# Patient Record
Sex: Female | Born: 1970 | Race: White | Hispanic: No | Smoking: Former smoker
Health system: Southern US, Community
[De-identification: ages and names within clinical notes are randomized; demographics above are authoritative.]

## PROBLEM LIST (undated history)

## (undated) DIAGNOSIS — R7989 Other specified abnormal findings of blood chemistry: Secondary | ICD-10-CM

## (undated) DIAGNOSIS — F319 Bipolar disorder, unspecified: Secondary | ICD-10-CM

## (undated) DIAGNOSIS — F419 Anxiety disorder, unspecified: Secondary | ICD-10-CM

## (undated) DIAGNOSIS — Z720 Tobacco use: Secondary | ICD-10-CM

## (undated) DIAGNOSIS — I10 Essential (primary) hypertension: Secondary | ICD-10-CM

## (undated) DIAGNOSIS — N641 Fat necrosis of breast: Secondary | ICD-10-CM

## (undated) DIAGNOSIS — Z8619 Personal history of other infectious and parasitic diseases: Secondary | ICD-10-CM

## (undated) DIAGNOSIS — K589 Irritable bowel syndrome without diarrhea: Secondary | ICD-10-CM

## (undated) DIAGNOSIS — B20 Human immunodeficiency virus [HIV] disease: Secondary | ICD-10-CM

## (undated) DIAGNOSIS — Z21 Asymptomatic human immunodeficiency virus [HIV] infection status: Secondary | ICD-10-CM

## (undated) HISTORY — PX: CHOLECYSTECTOMY: SHX55

## (undated) HISTORY — DX: Essential (primary) hypertension: I10

## (undated) HISTORY — DX: Anxiety disorder, unspecified: F41.9

## (undated) HISTORY — PX: INJECTION OF SILICONE OIL: SHX6422

## (undated) HISTORY — PX: CYST REMOVAL LEG: SHX6280

## (undated) HISTORY — DX: Human immunodeficiency virus (HIV) disease: B20

## (undated) HISTORY — PX: CYST EXCISION: SHX5701

## (undated) HISTORY — DX: Bipolar disorder, unspecified: F31.9

## (undated) HISTORY — DX: Personal history of other infectious and parasitic diseases: Z86.19

## (undated) HISTORY — DX: Irritable bowel syndrome, unspecified: K58.9

## (undated) HISTORY — DX: Other specified abnormal findings of blood chemistry: R79.89

## (undated) HISTORY — DX: Asymptomatic human immunodeficiency virus (hiv) infection status: Z21

## (undated) HISTORY — DX: Tobacco use: Z72.0

## (undated) HISTORY — DX: Fat necrosis of breast: N64.1

---

## 1991-05-11 HISTORY — PX: COLONOSCOPY: SHX174

## 2017-03-10 DIAGNOSIS — Z8619 Personal history of other infectious and parasitic diseases: Secondary | ICD-10-CM | POA: Insufficient documentation

## 2017-03-10 HISTORY — DX: Personal history of other infectious and parasitic diseases: Z86.19

## 2017-09-14 ENCOUNTER — Ambulatory Visit (INDEPENDENT_AMBULATORY_CARE_PROVIDER_SITE_OTHER): Payer: BLUE CROSS/BLUE SHIELD | Admitting: Physician Assistant

## 2017-09-14 ENCOUNTER — Encounter: Payer: Self-pay | Admitting: Physician Assistant

## 2017-09-14 VITALS — BP 145/84 | HR 99 | Ht 68.0 in | Wt 244.0 lb

## 2017-09-14 DIAGNOSIS — F17210 Nicotine dependence, cigarettes, uncomplicated: Secondary | ICD-10-CM

## 2017-09-14 DIAGNOSIS — Z13220 Encounter for screening for lipoid disorders: Secondary | ICD-10-CM

## 2017-09-14 DIAGNOSIS — Z720 Tobacco use: Secondary | ICD-10-CM | POA: Insufficient documentation

## 2017-09-14 DIAGNOSIS — R7989 Other specified abnormal findings of blood chemistry: Secondary | ICD-10-CM

## 2017-09-14 DIAGNOSIS — B370 Candidal stomatitis: Secondary | ICD-10-CM

## 2017-09-14 DIAGNOSIS — K589 Irritable bowel syndrome without diarrhea: Secondary | ICD-10-CM | POA: Insufficient documentation

## 2017-09-14 DIAGNOSIS — R030 Elevated blood-pressure reading, without diagnosis of hypertension: Secondary | ICD-10-CM

## 2017-09-14 DIAGNOSIS — Z716 Tobacco abuse counseling: Secondary | ICD-10-CM

## 2017-09-14 DIAGNOSIS — Z1329 Encounter for screening for other suspected endocrine disorder: Secondary | ICD-10-CM

## 2017-09-14 DIAGNOSIS — F1290 Cannabis use, unspecified, uncomplicated: Secondary | ICD-10-CM

## 2017-09-14 DIAGNOSIS — Z7689 Persons encountering health services in other specified circumstances: Secondary | ICD-10-CM

## 2017-09-14 DIAGNOSIS — E349 Endocrine disorder, unspecified: Secondary | ICD-10-CM

## 2017-09-14 DIAGNOSIS — Z131 Encounter for screening for diabetes mellitus: Secondary | ICD-10-CM

## 2017-09-14 DIAGNOSIS — Z130 Encounter for screening for diseases of the blood and blood-forming organs and certain disorders involving the immune mechanism: Secondary | ICD-10-CM

## 2017-09-14 DIAGNOSIS — Z1231 Encounter for screening mammogram for malignant neoplasm of breast: Secondary | ICD-10-CM

## 2017-09-14 MED ORDER — CLOTRIMAZOLE 10 MG MT TROC: 10.0000 mg | Freq: Every day | OROMUCOSAL | 0 refills | Status: AC

## 2017-09-14 MED ORDER — NICOTINE POLACRILEX 4 MG MT GUM: 4.0000 mg | CHEWING_GUM | OROMUCOSAL | 0 refills | Status: DC | PRN

## 2017-09-14 MED ORDER — NICOTINE 14 MG/24HR TD PT24: 14.0000 mg | MEDICATED_PATCH | Freq: Every day | TRANSDERMAL | 1 refills | Status: DC

## 2017-09-14 NOTE — Progress Notes (Signed)
HPI:                                                                Holly Walker is a 47 y.o. adult who presents to [name]: Primary Care Sports Medicine today to establish care  Current concerns: smoking cessation, HRT  Tobacco use: Currently smokes 10 cigarettes per day Began smoking approx 20 years ago First cigarette in the morning Longest quit attempt: Wellbutrin x 14 months  HRT: She is also interested in re-starting hormone replacement therapy. 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 She has been on Spironolactone and oral estrogen in the past (~10 years ago). No history of thromboembolism. Has never had a mammogram.  Depression screen [facility] 2/9 09/14/2017  Decreased Interest 1  Down, Depressed, Hopeless 0  PHQ - 2 Score 1  Altered sleeping 2  Tired, decreased energy 2  Change in appetite 2  Feeling bad or failure about yourself  0  Trouble concentrating 0  Moving slowly or fidgety/restless 0  Suicidal thoughts 0  PHQ-9 Score 7    GAD 7 : Generalized Anxiety Score 09/14/2017  Nervous, Anxious, on Edge 1  Control/stop worrying 1  Worry too much - different things 1  Trouble relaxing 1  Restless 0  Easily annoyed or irritable 1  Afraid - awful might happen 0  Total GAD 7 Score 5      Past Medical History:  Diagnosis Date  . Bipolar disorder (HCC)   . History of shingles 03/2017   right flank  . IBS (irritable bowel syndrome)   . Tobacco use    Past Surgical History:  Procedure Laterality Date  . CHOLECYSTECTOMY    . INJECTION OF SILICONE OIL     Social History   Tobacco Use  . Smoking status: Current Every Day Smoker    Packs/day: 0.50    Years: 20.00    Pack years: 10.00    Types: Cigarettes  . Smokeless tobacco: Never Used  Substance Use Topics  . Alcohol use: Not Currently    Alcohol/week: 0.6 - 1.2 oz    Types: 1 - 2 Standard drinks or equivalent per week   family history includes Diabetes in her maternal  grandfather and paternal grandmother; Heart attack in her maternal grandfather; Skin cancer in her maternal grandmother; Stroke in her maternal grandmother and paternal grandfather; Valvular heart disease in her maternal grandfather.    ROS: Review of Systems  Respiratory: Positive for wheezing.   Cardiovascular: Positive for palpitations.  Neurological: Positive for dizziness.  Psychiatric/Behavioral: Positive for depression (bipolar). The patient has insomnia.   All other systems reviewed and are negative.    Medications: Current Outpatient Medications  Medication Sig Dispense Refill  . clotrimazole (MYCELEX) 10 MG troche Take 1 tablet (10 mg total) by mouth 5 (five) times daily for 7 days. 35 tablet 0  . nicotine (NICODERM CQ) 14 mg/24hr patch Place 1 patch (14 mg total) onto the skin daily. 28 patch 1  . nicotine polacrilex (NICORETTE) 4 MG gum Take 1 each (4 mg total) by mouth as needed for smoking cessation. 100 tablet 0   No current facility-administered medications for this visit.    No Known Allergies     Objective:  BP (!) 145/84   Pulse 99   Ht  (1.727 m)   Wt 244 lb (110.7 kg)   BMI 37.10 kg/m  Gen:  alert, not ill-appearing, no distress, appropriate for age, obese female HEENT: head normocephalic without obvious abnormality, conjunctiva and cornea clear, oropharynx with white plaques, neck supple, no cervical adenopathy, trachea midline Pulm: Normal work of breathing, normal phonation, clear to auscultation bilaterally, no wheezes, rales or rhonchi CV: Normal rate, regular rhythm, s1 and s2 distinct, no murmurs, clicks or rubs  Neuro: alert and oriented x 3, no tremor MSK: extremities atraumatic, normal gait and station Skin: intact, no rashes on exposed skin, no jaundice, no cyanosis Psych: well-groomed, cooperative, good eye contact, euthymic mood, affect mood-congruent, speech is articulate, and thought processes clear and goal-directed    No results  found for this or any previous visit (from the past 72 hour(s)). No results found.    Assessment and Plan: 47 y.o. adult with   Encounter to establish care  Breast cancer screening by mammogram - Plan: MM 3D SCREEN BREAST BILATERAL  Endocrine disorder - Plan: Estradiol, Testosterone  Screening for diabetes mellitus - Plan: Hemoglobin A1c  Screening for blood disease - Plan: CBC, Comprehensive metabolic panel  Screening for thyroid disorder - Plan: TSH + free T4  Screening for lipid disorders - Plan: Lipid Panel w/reflex Direct LDL  Cigarette nicotine dependence without complication - Plan: nicotine (NICODERM CQ) 14 mg/24hr patch, nicotine polacrilex (NICORETTE) 4 MG gum  Elevated blood pressure reading  Oral candidiasis - Plan: clotrimazole (MYCELEX) 10 MG troche  Encounter for smoking cessation counseling  Marijuana smoker, episodic  - Personally reviewed PMH, PSH, PFH, medications, allergies, HM - Age-appropriate cancer screening: mammogram ordered, optional prostate cancer screening beginning age 46 - Influenza n/a - Tdap declined - PHQ2 negative  Endocrine disorder unspecified - labs pending, declines STI screening today - smoking cessation very important to reduce risk of VTE - provided with consent to HRT forms to review, initial and sign. Return at follow-up visit - DMV sex designation form completed - plan for oral estradiol 2-6 mg/d or estradiol patch 0.025-0.2 mg/d changed Q3-5d depending on patient preference. Optional Spironolactone 100-300 mg daily  Elevated blood pressure reading BP Readings from Last 3 Encounters:  09/14/17 (!) 145/84  - BP out of rnage in office today. Patient to monitor BP's at home and log readings - counseled on therapeutic lifestyle changes  Smoking cessation counseling Smoking cessation instruction/counseling given:  counseled patient on the dangers of tobacco use, advised patient to stop smoking, and reviewed strategies to  maximize success - discussed treatment options to include Wellbutrin, Chantix, and nicotine replacement. Avoiding Chantix due to patient's history of mood disorder and sleep difficulties. Patient opted for nicotine replacement. Will start 14 mg Nicoderm patch and supplement with gum/lozenge Weeks 1: nicoderm patch daily, use gum every 2 hours as needed Week 2: patch + gum every 4 hours as needed Week 3: patch + 1/2 dose gum every 4 hours as needed Week 4: patch only  Patient education and anticipatory guidance given Patient agrees with treatment plan Follow-up in 4 weeks for smoking cessation / blood pressure / HRT or sooner as needed if symptoms worsen or fail to improve  Levonne Hubert PA-C

## 2017-09-14 NOTE — Patient Instructions (Addendum)
Set a Quit Date Make a Quit IT trainer [place] Quit Line and download a Quit app No cigarettes beginning on your quit date Weeks 1: nicoderm patch daily, use gum every 2 hours as needed Week 2: patch + gum every 4 hours as needed Week 3: patch + 1/2 dose gum every 4 hours as needed Week 4: patch only   Coping with Quitting Smoking Quitting smoking is a physical and mental challenge. You will face cravings, withdrawal symptoms, and temptation. Before quitting, work with your health care provider to make a plan that can help you cope. Preparation can help you quit and keep you from giving in. How can I cope with cravings? Cravings usually last for 5-10 minutes. If you get through it, the craving will pass. Consider taking the following actions to help you cope with cravings:  Keep your mouth busy: ? Chew sugar-free gum. ? Suck on hard candies or a straw. ? Brush your teeth.  Keep your hands and body busy: ? Immediately change to a different activity when you feel a craving. ? Squeeze or play with a ball. ? Do an activity or a hobby, like making bead jewelry, practicing needlepoint, or working with wood. ? Mix up your normal routine. ? Take a short exercise break. Go for a quick walk or run up and down stairs. ? Spend time in public places where smoking is not allowed.  Focus on doing something kind or helpful for someone else.  Call a friend or family member to talk during a craving.  Join a support group.  Call a quit line, such as 1-800-QUIT-NOW.  Talk with your health care provider about medicines that might help you cope with cravings and make quitting easier for you.  How can I deal with withdrawal symptoms? Your body may experience negative effects as it tries to get used to not having nicotine in the system. These effects are called withdrawal symptoms. They may include:  Feeling hungrier than normal.  Trouble concentrating.  Irritability.  Trouble  sleeping.  Feeling depressed.  Restlessness and agitation.  Craving a cigarette.  To manage withdrawal symptoms:  Avoid places, people, and activities that trigger your cravings.  Remember why you want to quit.  Get plenty of sleep.  Avoid coffee and other caffeinated drinks. These may worsen some of your symptoms.  How can I handle social situations? Social situations can be difficult when you are quitting smoking, especially in the first few weeks. To manage this, you can:  Avoid parties, bars, and other social situations where people might be smoking.  Avoid alcohol.  Leave right away if you have the urge to smoke.  Explain to your family and friends that you are quitting smoking. Ask for understanding and support.  Plan activities with friends or family where smoking is not an option.  What are some ways I can cope with stress? Wanting to smoke may cause stress, and stress can make you want to smoke. Find ways to manage your stress. Relaxation techniques can help. For example:  Breathe slowly and deeply, in through your nose and out through your mouth.  Listen to soothing, relaxing music.  Talk with a family member or friend about your stress.  Light a candle.  Soak in a bath or take a shower.  Think about a peaceful place.  What are some ways I can prevent weight gain? Be aware that many people gain weight after they quit smoking. However, not everyone does. To keep  from gaining weight, have a plan in place before you quit and stick to the plan after you quit. Your plan should include:  Having healthy snacks. When you have a craving, it may help to: ? Eat plain popcorn, crunchy carrots, celery, or other cut vegetables. ? Chew sugar-free gum.  Changing how you eat: ? Eat small portion sizes at meals. ? Eat 4-6 small meals throughout the day instead of 1-2 large meals a day. ? Be mindful when you eat. Do not watch television or do other things that might  distract you as you eat.  Exercising regularly: ? Make time to exercise each day. If you do not have time for a long workout, do short bouts of exercise for 5-10 minutes several times a day. ? Do some form of strengthening exercise, like weight lifting, and some form of aerobic exercise, like running or swimming.  Drinking plenty of water or other low-calorie or no-calorie drinks. Drink 6-8 glasses of water daily, or as much as instructed by your health care provider.  Summary  Quitting smoking is a physical and mental challenge. You will face cravings, withdrawal symptoms, and temptation to smoke again. Preparation can help you as you go through these challenges.  You can cope with cravings by keeping your mouth busy (such as by chewing gum), keeping your body and hands busy, and making calls to family, friends, or a helpline for people who want to quit smoking.  You can cope with withdrawal symptoms by avoiding places where people smoke, avoiding drinks with caffeine, and getting plenty of rest.  Ask your health care provider about the different ways to prevent weight gain, avoid stress, and handle social situations. This information is not intended to replace advice given to you by your health care provider. Make sure you discuss any questions you have with your health care provider. Document Released: 04/23/2016 Document Revised: 04/23/2016 Document Reviewed: 04/23/2016 Elsevier Interactive Patient Education  [employer].

## 2017-09-17 LAB — HEMOGLOBIN A1C
EAG (MMOL/L): 5.7 (calc)
HEMOGLOBIN A1C: 5.2 %{Hb} (ref <5.7)
MEAN PLASMA GLUCOSE: 103 (calc)

## 2017-09-17 LAB — COMPREHENSIVE METABOLIC PANEL
AG Ratio: 1.4 (calc) (ref 1.0–2.5)
ALBUMIN MSPROF: 4.3 g/dL (ref 3.6–5.1)
ALKALINE PHOSPHATASE (APISO): 68 U/L (ref 40–115)
ALT: 22 U/L (ref 9–46)
AST: 21 U/L (ref 10–40)
BILIRUBIN TOTAL: 0.4 mg/dL (ref 0.2–1.2)
BUN: 13 mg/dL (ref 7–25)
CALCIUM: 9.1 mg/dL (ref 8.6–10.3)
CHLORIDE: 106 mmol/L (ref 98–110)
CO2: 27 mmol/L (ref 20–32)
Creat: 0.74 mg/dL (ref 0.60–1.35)
GLOBULIN: 3 g/dL (ref 1.9–3.7)
Glucose, Bld: 99 mg/dL (ref 65–99)
POTASSIUM: 4.4 mmol/L (ref 3.5–5.3)
SODIUM: 138 mmol/L (ref 135–146)
Total Protein: 7.3 g/dL (ref 6.1–8.1)

## 2017-09-17 LAB — LIPID PANEL W/REFLEX DIRECT LDL
CHOLESTEROL: 125 mg/dL (ref <200)
HDL: 34 mg/dL — ABNORMAL LOW (ref >40)
LDL Cholesterol (Calc): 72 mg/dL (calc)
Non-HDL Cholesterol (Calc): 91 mg/dL (calc) (ref <130)
Total CHOL/HDL Ratio: 3.7 (calc) (ref <5.0)
Triglycerides: 102 mg/dL (ref <150)

## 2017-09-17 LAB — CBC
HEMATOCRIT: 43.7 % (ref 38.5–50.0)
HEMOGLOBIN: 15.3 g/dL (ref 13.2–17.1)
MCH: 32.7 pg (ref 27.0–33.0)
MCHC: 35 g/dL (ref 32.0–36.0)
MCV: 93.4 fL (ref 80.0–100.0)
MPV: 9.8 fL (ref 7.5–12.5)
Platelets: 178 10*3/uL (ref 140–400)
RBC: 4.68 10*6/uL (ref 4.20–5.80)
RDW: 12.8 % (ref 11.0–15.0)
WBC: 4.3 10*3/uL (ref 3.8–10.8)

## 2017-09-17 LAB — T4, FREE: Free T4: 1 ng/dL (ref 0.8–1.8)

## 2017-09-17 LAB — TESTOSTERONE: TESTOSTERONE: 391 ng/dL (ref 250–827)

## 2017-09-17 LAB — ESTRADIOL: Estradiol: 39 pg/mL (ref <=39)

## 2017-09-17 LAB — TSH+FREE T4: TSH W/REFLEX TO FT4: 0.36 mIU/L — ABNORMAL LOW (ref 0.40–4.50)

## 2017-09-20 ENCOUNTER — Encounter: Payer: Self-pay | Admitting: Physician Assistant

## 2017-09-20 DIAGNOSIS — Z1231 Encounter for screening mammogram for malignant neoplasm of breast: Secondary | ICD-10-CM | POA: Insufficient documentation

## 2017-09-20 DIAGNOSIS — R030 Elevated blood-pressure reading, without diagnosis of hypertension: Secondary | ICD-10-CM | POA: Insufficient documentation

## 2017-09-20 DIAGNOSIS — F17210 Nicotine dependence, cigarettes, uncomplicated: Secondary | ICD-10-CM | POA: Insufficient documentation

## 2017-09-20 DIAGNOSIS — Z716 Tobacco abuse counseling: Secondary | ICD-10-CM | POA: Insufficient documentation

## 2017-09-20 DIAGNOSIS — E349 Endocrine disorder, unspecified: Secondary | ICD-10-CM | POA: Insufficient documentation

## 2017-09-20 DIAGNOSIS — B370 Candidal stomatitis: Secondary | ICD-10-CM | POA: Insufficient documentation

## 2017-09-20 DIAGNOSIS — F319 Bipolar disorder, unspecified: Secondary | ICD-10-CM | POA: Insufficient documentation

## 2017-09-20 DIAGNOSIS — F1290 Cannabis use, unspecified, uncomplicated: Secondary | ICD-10-CM | POA: Insufficient documentation

## 2017-09-20 NOTE — Progress Notes (Signed)
Labs look great overall - thyroid function was abnormal so I am going to recheck this in a week  Otherwise - normal kidney function - cholesterol in a healthy range - normal blood counts - no evidence of diabetes  Schedule follow-up appointment so we can start estrogen

## 2017-09-21 DIAGNOSIS — R7989 Other specified abnormal findings of blood chemistry: Secondary | ICD-10-CM | POA: Insufficient documentation

## 2017-09-21 HISTORY — DX: Other specified abnormal findings of blood chemistry: R79.89

## 2017-09-21 NOTE — Addendum Note (Signed)
Addended by: Gena Fray E on: 09/21/2017 11:45 AM   Modules accepted: Orders

## 2017-09-29 ENCOUNTER — Ambulatory Visit: Payer: BLUE CROSS/BLUE SHIELD

## 2017-09-29 ENCOUNTER — Telehealth: Payer: Self-pay | Admitting: Physician Assistant

## 2017-09-29 ENCOUNTER — Other Ambulatory Visit: Payer: Self-pay

## 2017-09-29 DIAGNOSIS — R7989 Other specified abnormal findings of blood chemistry: Secondary | ICD-10-CM

## 2017-09-29 DIAGNOSIS — N63 Unspecified lump in unspecified breast: Secondary | ICD-10-CM

## 2017-09-29 DIAGNOSIS — Z1231 Encounter for screening mammogram for malignant neoplasm of breast: Secondary | ICD-10-CM

## 2017-09-29 NOTE — Telephone Encounter (Signed)
-----   Message from Almeta Monas sent at 09/29/2017 10:48 AM EDT ----- [facility] Morning! This pt came down this am wanting the Mammo done, unfortunately this pt has to go to the [facility], when I was talking with the patient she told me that she is feeling lumpies and bumpies and has had several silicon injections over the years, due to these both she will have to go to [facility] and have a Diagnostic. I spoke at length with the pt and she was very appreciative and understanding that this is path we have to take due to hx and presenting with problems.   Thanks, Denny Peon

## 2017-09-30 LAB — THYROGLOBULIN ANTIBODIES (REFL)

## 2017-09-30 LAB — T3, FREE: T3, Free: 3 pg/mL (ref 2.3–4.2)

## 2017-10-09 NOTE — Progress Notes (Signed)
Thyroid hormones and thyroid antibody are in a normal range I would like to get a thyroid ultrasound to make sure there is not a nodule that is secreting hormone and causing TSH to be low This has been ordered and [facility]

## 2017-10-12 ENCOUNTER — Encounter: Payer: Self-pay | Admitting: Physician Assistant

## 2017-10-12 ENCOUNTER — Ambulatory Visit (INDEPENDENT_AMBULATORY_CARE_PROVIDER_SITE_OTHER): Payer: BLUE CROSS/BLUE SHIELD | Admitting: Physician Assistant

## 2017-10-12 VITALS — BP 130/83 | HR 72 | Resp 16 | Wt 247.3 lb

## 2017-10-12 DIAGNOSIS — E349 Endocrine disorder, unspecified: Secondary | ICD-10-CM | POA: Diagnosis not present

## 2017-10-12 DIAGNOSIS — I10 Essential (primary) hypertension: Secondary | ICD-10-CM | POA: Diagnosis not present

## 2017-10-12 MED ORDER — ESTRADIOL 2 MG PO TABS: 2.0000 mg | ORAL_TABLET | Freq: Every day | ORAL | 2 refills | Status: DC

## 2017-10-12 MED ORDER — SPIRONOLACTONE 100 MG PO TABS: 100.0000 mg | ORAL_TABLET | Freq: Every day | ORAL | 2 refills | Status: DC

## 2017-10-12 NOTE — Progress Notes (Signed)
HPI:                                                                Holly Walker is a 47 y.o. adult who presents to [facility]: Primary Care Sports Medicine today for 2 month follow-up  Quit smoking cold [place] Sep 16, 2017. Reports withdrawal symptoms for the first 3 days including sleep disturbance. Has used chewing gum, suckers and an unlit cigarette to manage cravings.   Here today to re-start cross-sex hormone therapy. Main concern is hair loss. Labs from 09/16/17 show total testosterone is high (391) and estradiol is low (39). It has been over a year since she has been on any hormone therapy.  Has checked BP at the pharmacy on 2 occasions and reports results were yellow and green. Forgot to purchase a BP cuff but intends to.  Depression screen PHQ 2/9 09/14/2017  Decreased Interest 1  Down, Depressed, Hopeless 0  PHQ - 2 Score 1  Altered sleeping 2  Tired, decreased energy 2  Change in appetite 2  Feeling bad or failure about yourself  0  Trouble concentrating 0  Moving slowly or fidgety/restless 0  Suicidal thoughts 0  PHQ-9 Score 7    GAD 7 : Generalized Anxiety Score 09/14/2017  Nervous, Anxious, on Edge 1  Control/stop worrying 1  Worry too much - different things 1  Trouble relaxing 1  Restless 0  Easily annoyed or irritable 1  Afraid - awful might happen 0  Total GAD 7 Score 5      Past Medical History:  Diagnosis Date  . Bipolar disorder (HCC)   . History of shingles 03/2017   right flank  . IBS (irritable bowel syndrome)   . Tobacco use    Past Surgical History:  Procedure Laterality Date  . CHOLECYSTECTOMY    . INJECTION OF SILICONE OIL     Social History   Tobacco Use  . Smoking status: Current Every Day Smoker    Packs/day: 0.50    Years: 20.00    Pack years: 10.00    Types: Cigarettes  . Smokeless tobacco: Never Used  Substance Use Topics  . Alcohol use: Not Currently    Alcohol/week: 0.6 - 1.2 oz    Types: 1 - 2  Standard drinks or equivalent per week   family history includes Diabetes in her maternal grandfather and paternal grandmother; Heart attack in her maternal grandfather; Skin cancer in her maternal grandmother; Stroke in her maternal grandmother and paternal grandfather; Valvular heart disease in her maternal grandfather.    ROS: negative except as noted in the HPI  Medications: Current Outpatient Medications  Medication Sig Dispense Refill  . nicotine (NICODERM CQ) 14 mg/24hr patch Place 1 patch (14 mg total) onto the skin daily. (Patient not taking: Reported on 10/12/2017) 28 patch 1  . nicotine polacrilex (NICORETTE) 4 MG gum Take 1 each (4 mg total) by mouth as needed for smoking cessation. (Patient not taking: Reported on 10/12/2017) 100 tablet 0   No current facility-administered medications for this visit.    No Known Allergies     Objective:  BP 130/83   Pulse 72   Resp 16   Wt 247 lb 4.8 oz (112.2 kg)   SpO2  100%   BMI 37.60 kg/m  Gen:  alert, not ill-appearing, no distress, appropriate for age, obese female HEENT: head normocephalic without obvious abnormality, conjunctiva and cornea clear, trachea midline Pulm: Normal work of breathing, normal phonation, clear to auscultation bilaterally, no wheezes, rales or rhonchi CV: Normal rate, regular rhythm, s1 and s2 distinct, no murmurs, clicks or rubs  Neuro: alert and oriented x 3, no tremor MSK: extremities atraumatic, normal gait and station Skin: intact, no rashes on exposed skin, no jaundice, no cyanosis Psych: well-groomed, cooperative, good eye contact, euthymic mood, affect mood-congruent, speech is articulate, and thought processes clear and goal-directed  Lab Results  Component Value Date   CREATININE 0.74 09/16/2017   BUN 13 09/16/2017   NA 138 09/16/2017   K 4.4 09/16/2017   CL 106 09/16/2017   CO2 27 09/16/2017   Lab Results  Component Value Date   ALT 22 09/16/2017   AST 21 09/16/2017   BILITOT 0.4  09/16/2017   Lab Results  Component Value Date   WBC 4.3 09/16/2017   HGB 15.3 09/16/2017   HCT 43.7 09/16/2017   MCV 93.4 09/16/2017   PLT 178 09/16/2017     Lab Results  Component Value Date   TESTOSTERONE 391 09/16/2017      No results found for this or any previous visit (from the past 72 hour(s)). No results found.    Assessment and Plan: 47 y.o. adult with   Endocrine disorder - Plan: spironolactone (ALDACTONE) 100 MG tablet, estradiol (ESTRACE) 2 MG tablet - pleasant 47 yo transgender female here for cross-sex hormone therapy. Risks/benefits and consent to treatment were reviewed in detail at last visit. Labs including renal and liver function were performed last month and are wnl. Testosterone and estradiol are in female physiologic range as expected due to absence of exogenous hormone - since there is concern about hair loss, starting Spironolactone at 100 mg daily - starting Estradiol 2 mg daily. May switch to transdermal preparation + baby asa if she resumes smoking, but hopefully that won't be the case - recheck labs in 3 months   Also instructed to contact breast center to schedule mammogram. Order was changed from screening to diagnostic due to hx of silicone injections  Hypertension BP Readings from Last 3 Encounters:  10/12/17 130/83  09/14/17 (!) 145/84  - counseled on therapeutic lifestyle changes - praised for smoking cessation. Will consider baby asa if she resumes smoking for primary prevention. Would also consider antihypertensive medication at that point - patient to monitor BP at home and record readings   Patient education and anticipatory guidance given Patient agrees with treatment plan Follow-up in 3 months or sooner as needed if symptoms worsen or fail to improve  Levonne Hubertharley E. Orissa Arreaga PA-C

## 2017-10-12 NOTE — Patient Instructions (Addendum)
Call [facility] to schedule mammogram [phone]   Start Oral Estradiol 2 mg daily and Spironolactone 100 mg daily Follow-up in 3 months

## 2017-10-26 ENCOUNTER — Other Ambulatory Visit: Payer: Self-pay | Admitting: Physician Assistant

## 2017-10-26 DIAGNOSIS — N63 Unspecified lump in unspecified breast: Secondary | ICD-10-CM

## 2017-11-04 ENCOUNTER — Ambulatory Visit: Payer: BLUE CROSS/BLUE SHIELD

## 2017-11-04 ENCOUNTER — Ambulatory Visit
Admission: RE | Admit: 2017-11-04 | Discharge: 2017-11-04 | Disposition: A | Discharge status: Home / Self Care | Payer: BLUE CROSS/BLUE SHIELD | Source: Ambulatory Visit | Attending: Physician Assistant | Admitting: Physician Assistant

## 2017-11-04 DIAGNOSIS — N63 Unspecified lump in unspecified breast: Secondary | ICD-10-CM

## 2017-11-06 ENCOUNTER — Encounter: Payer: Self-pay | Admitting: Physician Assistant

## 2017-11-06 DIAGNOSIS — N641 Fat necrosis of breast: Secondary | ICD-10-CM | POA: Insufficient documentation

## 2017-11-06 HISTORY — DX: Fat necrosis of breast: N64.1

## 2017-11-06 NOTE — Progress Notes (Signed)
Mammogram does not show any concerning masses There are some areas of fat necrosis related to the silicone. Would recommend following up with a plastic surgeon as needed Otherwise, guidelines recommend screening mammogram every 2 years beginning at age 46

## 2018-01-12 ENCOUNTER — Other Ambulatory Visit: Payer: Self-pay | Admitting: Physician Assistant

## 2018-01-12 DIAGNOSIS — E349 Endocrine disorder, unspecified: Secondary | ICD-10-CM

## 2018-02-06 ENCOUNTER — Ambulatory Visit (INDEPENDENT_AMBULATORY_CARE_PROVIDER_SITE_OTHER): Payer: BLUE CROSS/BLUE SHIELD | Admitting: Physician Assistant

## 2018-02-06 VITALS — BP 121/82 | HR 76 | Wt 257.0 lb

## 2018-02-06 DIAGNOSIS — Z79899 Other long term (current) drug therapy: Secondary | ICD-10-CM | POA: Diagnosis not present

## 2018-02-06 DIAGNOSIS — L659 Nonscarring hair loss, unspecified: Secondary | ICD-10-CM | POA: Diagnosis not present

## 2018-02-06 DIAGNOSIS — I10 Essential (primary) hypertension: Secondary | ICD-10-CM

## 2018-02-06 DIAGNOSIS — E349 Endocrine disorder, unspecified: Secondary | ICD-10-CM

## 2018-02-06 DIAGNOSIS — L989 Disorder of the skin and subcutaneous tissue, unspecified: Secondary | ICD-10-CM

## 2018-02-06 DIAGNOSIS — L720 Epidermal cyst: Secondary | ICD-10-CM

## 2018-02-06 MED ORDER — ESTRADIOL 2 MG PO TABS: ORAL_TABLET | ORAL | 5 refills | Status: DC

## 2018-02-06 MED ORDER — MINOXIDIL 5 % EX FOAM: 1.0000 mL | Freq: Two times a day (BID) | CUTANEOUS | 1 refills | Status: DC

## 2018-02-06 MED ORDER — SPIRONOLACTONE 100 MG PO TABS: 100.0000 mg | ORAL_TABLET | Freq: Two times a day (BID) | ORAL | 5 refills | Status: DC

## 2018-02-06 NOTE — Progress Notes (Signed)
HPI:                                                                Holly Walker is a 47 y.o. adult who presents to [name]: Primary Care Sports Medicine today for medication management  Very pleasant 47 yo transgender female, AMAB. Doing well on oral estradiol 2 mg daily and spironolactone 100 mg bid.  Main concern is thinning hair.  HTN: managed with lifestyle. Does not checks BP's at home. Denies vision change, headache, chest pain with exertion, orthopnea, lightheadedness, syncope and edema. Risk factors include: family hx, obesity  Also reports a recurrent nodule of the left posterior thigh. Has been present off and on for several years. It occasionally swells and becomes red. Denies tenderness or drainage.  Also complains of a skin lesion of left neck. It has been catching on clothing. Denies tenderness, bleeding, or itching. It has been present for at least the past week, perhaps longer. Denies personal or family history of skin cancer.   Past Medical History:  Diagnosis Date  . Bipolar disorder (HCC)   . 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 History of shingles 03/2017   right flank  . IBS (irritable bowel syndrome)   . Low serum thyroid stimulating hormone (TSH) 09/21/2017  . Tobacco use    Past Surgical History:  Procedure Laterality Date  . CHOLECYSTECTOMY    . INJECTION OF SILICONE OIL     Social History   Tobacco Use  . Smoking status: Former Smoker    Packs/day: 0.50    Years: 20.00    Pack years: 10.00    Types: Cigarettes    Last attempt to quit: 09/16/2017    Years since quitting: 0.4  . Smokeless tobacco: Never Used  Substance Use Topics  . Alcohol use: Not Currently    Alcohol/week: 1.0 - 2.0 standard drinks    Types: 1 - 2 Standard drinks or equivalent per week   family history includes Breast cancer in her paternal aunt; Diabetes in her maternal grandfather and paternal grandmother; Heart attack in her maternal grandfather; Skin  cancer in her maternal grandmother; Stroke in her maternal grandmother and paternal grandfather; Valvular heart disease in her maternal grandfather.    ROS: negative except as noted in the HPI  Medications: Current Outpatient Medications  Medication Sig Dispense Refill  . estradiol (ESTRACE) 2 MG tablet TAKE 1 TABLET(2 MG) BY MOUTH DAILY 30 tablet 5  . spironolactone (ALDACTONE) 100 MG tablet Take 1 tablet (100 mg total) by mouth 2 (two) times daily. 60 tablet 5  . Minoxidil 5 % FOAM Apply 1 mL topically 2 (two) times daily. 60 g 1   No current facility-administered medications for this visit.    No Known Allergies     Objective:  BP 121/82   Pulse 76   Wt 257 lb (116.6 kg)   BMI 39.08 kg/m  Gen:  alert, not ill-appearing, no distress, appropriate for age, obese adult HEENT: head normocephalic without obvious abnormality, conjunctiva and cornea clear, trachea midline Pulm: Normal work of breathing, normal phonation, clear to auscultation bilaterally, no wheezes, rales or rhonchi CV: Normal rate, regular rhythm, s1 and s2 distinct, no murmurs, clicks or rubs  Neuro: alert and oriented  x 3, no tremor MSK: extremities atraumatic, normal gait and station Skin: left posterior thigh there is approx 1.5 cm soft-medium nodule with overlying erythema, no induration or warmth; left trapezial area there is a 4mm pearly papule with fine scale Psych: well-groomed, cooperative, good eye contact, euthymic mood, affect mood-congruent, speech is articulate, and thought processes clear and goal-directed  Shave Biopsy Procedure Note  Pre-operative Diagnosis: Suspicious lesion  Post-operative Diagnosis: same  Locations:left lateral neck/upper trapezial region  Indications: discomfort  Anesthesia: Lidocaine 1% with epinephrine without added sodium bicarbonate  Procedure Details  History of allergy to iodine: no  Patient informed of the risks (including bleeding and infection) and  benefits of the  procedure and Verbal informed consent obtained.  The lesion and surrounding area were given a sterile prep using chlorhexidine and draped in the usual sterile fashion. A scalpel was used to shave an area of skin approximately 2 mm x 2 mm.  Hemostasis achieved with heat cautery. Antibiotic ointment and a sterile dressing applied.  The specimen was sent for pathologic examination. The patient tolerated the procedure well.    Condition: Stable  Complications: none.   No results found for this or any previous visit (from the past 72 hour(s)). No results found.    Assessment and Plan: 47 y.o. adult with   .Adam was seen today for medication management.  Diagnoses and all orders for this visit:  Encounter for medication management  Endocrine disorder -     estradiol (ESTRACE) 2 MG tablet; TAKE 1 TABLET(2 MG) BY MOUTH DAILY -     spironolactone (ALDACTONE) 100 MG tablet; Take 1 tablet (100 mg total) by mouth 2 (two) times daily.  Skin lesion of back -     Dermatology pathology  Hair loss -     Minoxidil 5 % FOAM; Apply 1 mL topically 2 (two) times daily.  Epidermoid cyst of skin  Hypertension goal BP (blood pressure) < 130/80   Shave biopsy performed in office today  1. Instructed to keep the wound dry and covered for 24-48h and clean thereafter. 2. Warning signs of infection were reviewed.   3. Recommended that the patient use OTC analgesics as needed for pain.    Patient education and anticipatory guidance given Patient agrees with treatment plan Follow-up in 6 months or sooner as needed if symptoms worsen or fail to improve  Levonne Hubert PA-C

## 2018-02-15 ENCOUNTER — Encounter: Payer: Self-pay | Admitting: Physician Assistant

## 2018-02-15 DIAGNOSIS — L659 Nonscarring hair loss, unspecified: Secondary | ICD-10-CM | POA: Insufficient documentation

## 2018-02-15 DIAGNOSIS — L720 Epidermal cyst: Secondary | ICD-10-CM | POA: Insufficient documentation

## 2018-09-04 ENCOUNTER — Other Ambulatory Visit: Payer: Self-pay | Admitting: Physician Assistant

## 2018-09-04 DIAGNOSIS — E349 Endocrine disorder, unspecified: Secondary | ICD-10-CM

## 2018-09-04 DIAGNOSIS — I10 Essential (primary) hypertension: Secondary | ICD-10-CM

## 2018-09-04 DIAGNOSIS — R7989 Other specified abnormal findings of blood chemistry: Secondary | ICD-10-CM

## 2018-09-04 DIAGNOSIS — Z5181 Encounter for therapeutic drug level monitoring: Secondary | ICD-10-CM

## 2018-09-04 NOTE — Telephone Encounter (Signed)
90 day supply of medication sent Due for labs in May

## 2018-12-09 ENCOUNTER — Other Ambulatory Visit: Payer: Self-pay | Admitting: Physician Assistant

## 2018-12-09 DIAGNOSIS — E349 Endocrine disorder, unspecified: Secondary | ICD-10-CM

## 2018-12-21 ENCOUNTER — Encounter: Payer: Self-pay | Admitting: Physician Assistant

## 2018-12-21 ENCOUNTER — Ambulatory Visit (INDEPENDENT_AMBULATORY_CARE_PROVIDER_SITE_OTHER): Payer: BLUE CROSS/BLUE SHIELD | Admitting: Physician Assistant

## 2018-12-21 ENCOUNTER — Other Ambulatory Visit: Payer: Self-pay

## 2018-12-21 VITALS — BP 136/83 | HR 97 | Temp 98.2°F | Wt 255.0 lb

## 2018-12-21 DIAGNOSIS — L659 Nonscarring hair loss, unspecified: Secondary | ICD-10-CM

## 2018-12-21 DIAGNOSIS — E349 Endocrine disorder, unspecified: Secondary | ICD-10-CM

## 2018-12-21 DIAGNOSIS — I10 Essential (primary) hypertension: Secondary | ICD-10-CM

## 2018-12-21 DIAGNOSIS — F640 Transsexualism: Secondary | ICD-10-CM

## 2018-12-21 DIAGNOSIS — R2242 Localized swelling, mass and lump, left lower limb: Secondary | ICD-10-CM | POA: Diagnosis not present

## 2018-12-21 DIAGNOSIS — IMO0001 Reserved for inherently not codable concepts without codable children: Secondary | ICD-10-CM

## 2018-12-21 DIAGNOSIS — Z789 Other specified health status: Secondary | ICD-10-CM

## 2018-12-21 MED ORDER — SPIRONOLACTONE 100 MG PO TABS: 100.0000 mg | ORAL_TABLET | Freq: Every day | ORAL | 1 refills | Status: DC

## 2018-12-21 MED ORDER — FINASTERIDE 5 MG PO TABS: 5.0000 mg | ORAL_TABLET | Freq: Every day | ORAL | 1 refills | Status: DC

## 2018-12-21 MED ORDER — AMLODIPINE BESYLATE 5 MG PO TABS: 5.0000 mg | ORAL_TABLET | Freq: Every day | ORAL | 1 refills | Status: DC

## 2018-12-21 MED ORDER — ESTRADIOL 0.1 MG/24HR TD PTTW: 1.0000 | MEDICATED_PATCH | TRANSDERMAL | 1 refills | Status: DC

## 2018-12-21 NOTE — Progress Notes (Signed)
HPI:                                                                Holly Walker is a 48 y.o. adult who [facility] [facility] today for medication management  Left thigh mass: lump on left posterior thigh has been present off and on for several years. It occasionally swells and becomes red. Denies tenderness or drainage. Recently she has noticed that it is slightly tender and causing her to lean to the right when she is sitting. She would like it removed.  HRT: has been on and off feminizing hormones for 8 years. She has been on low-dose oral estradiol 2 mg once a day and spironolactone 200 mg daily since June 2019. No concerns apart from hair thinning, which she treats with OTC Minoxidil. She does occasionally smoke cigarettes socially. She has a history of silicone breast injections and lumpy breasts, so she underwent diagnostic mammo 10/2017, which was negative.   Past Medical History:  Diagnosis Date  . Bipolar disorder (HCC)   . 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 History of shingles 03/2017   right flank  . IBS (irritable bowel syndrome)   . Low serum thyroid stimulating hormone (TSH) 09/21/2017  . Tobacco use    Past Surgical History:  Procedure Laterality Date  . CHOLECYSTECTOMY    . INJECTION OF SILICONE OIL     Social History   Tobacco Use  . Smoking status: Former Smoker    Packs/day: 0.50    Years: 20.00    Pack years: 10.00    Types: Cigarettes    Quit date: 09/16/2017    Years since quitting: 1.2  . Smokeless tobacco: Never Used  Substance Use Topics  . Alcohol use: Not Currently    Alcohol/week: 1.0 - 2.0 standard drinks    Types: 1 - 2 Standard drinks or equivalent per week   family history includes Breast cancer in her paternal aunt; Diabetes in her maternal grandfather and paternal grandmother; Heart attack in her maternal grandfather; Skin cancer in her maternal grandmother; Stroke in her maternal  grandmother and paternal grandfather; Valvular heart disease in her maternal grandfather.    ROS: negative except as noted in the HPI  Medications: Current Outpatient Medications  Medication Sig Dispense Refill  . Minoxidil 5 % FOAM Apply 1 mL topically 2 (two) times daily. 60 g 1  . spironolactone (ALDACTONE) 100 MG tablet Take 1 tablet (100 mg total) by mouth daily. 90 tablet 1  . amLODipine (NORVASC) 5 MG tablet Take 1 tablet (5 mg total) by mouth daily. 90 tablet 1  . estradiol (VIVELLE-DOT) 0.1 MG/24HR patch Place 1 patch (0.1 mg total) onto the skin 2 (two) times a week. 24 patch 1  . finasteride (PROSCAR) 5 MG tablet Take 1 tablet (5 mg total) by mouth daily. 90 tablet 1   No current facility-administered medications for this visit.    No Known Allergies     Objective:  BP 136/83   Pulse 97   Temp 98.2 F (36.8 C) (Oral)   Wt 255 lb (115.7 kg)   BMI 38.77 kg/m   Wt Readings from Last 3 Encounters:  12/21/18 255 lb (115.7 kg)  02/06/18  257 lb (116.6 kg)  10/12/17 247 lb 4.8 oz (112.2 kg)   Temp Readings from Last 3 Encounters:  12/21/18 98.2 F (36.8 C) (Oral)   BP Readings from Last 3 Encounters:  12/21/18 136/83  02/06/18 121/82  10/12/17 130/83   Pulse Readings from Last 3 Encounters:  12/21/18 97  02/06/18 76  10/12/17 72    Gen:  alert, not ill-appearing, no distress, appropriate for age, obese adult HEENT: head normocephalic without obvious abnormality, conjunctiva and cornea clear, trachea midline Pulm: Normal work of breathing, normal phonation, clear to auscultation bilaterally, no wheezes, rales or rhonchi CV: Normal rate, regular rhythm, s1 and s2 distinct, no murmurs, clicks or rubs  Neuro: alert and oriented x 3, no tremor MSK: extremities atraumatic, normal gait and station, no peripheral edema Skin: intact, no rashes on exposed skin, no jaundice, no cyanosis Psych: well-groomed, cooperative, good eye contact, euthymic mood, affect  mood-congruent, speech is articulate, and thought processes clear and goal-directed  Lab Results  Component Value Date   CREATININE 0.74 09/16/2017   BUN 13 09/16/2017   NA 138 09/16/2017   K 4.4 09/16/2017   CL 106 09/16/2017   CO2 27 09/16/2017   Lab Results  Component Value Date   ALT 22 09/16/2017   AST 21 09/16/2017   BILITOT 0.4 09/16/2017   Lab Results  Component Value Date   WBC 4.3 09/16/2017   HGB 15.3 09/16/2017   HCT 43.7 09/16/2017   MCV 93.4 09/16/2017   PLT 178 09/16/2017   Lab Results  Component Value Date   CHOL 125 09/16/2017   HDL 34 (L) 09/16/2017   LDLCALC 72 09/16/2017   TRIG 102 09/16/2017   CHOLHDL 3.7 09/16/2017     Assessment and Plan: 48 y.o. adult with   .Adam was seen today for medication management.  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 -     spironolactone (ALDACTONE) 100 MG tablet; Take 1 tablet (100 mg total) by mouth daily. -     finasteride (PROSCAR) 5 MG tablet; Take 1 tablet (5 mg total) by mouth daily.  Hair loss -     finasteride (PROSCAR) 5 MG tablet; Take 1 tablet (5 mg total) by mouth daily.  Hypertension goal BP (blood pressure) < 130/80 -     amLODipine (NORVASC) 5 MG tablet; Take 1 tablet (5 mg total) by mouth daily.  Mass of left lower extremity -     Cancel: US SOFT TISSUE LOWER EXTREMITY LIMITED RIGHT (NON-VASCULAR) -     US LT LOWER EXTREM LTD SOFT TISSUE NON VASCULAR   Mass of left thigh US to assess. DDx includes epidermoid cyst and lipoma She will schedule appt with Dr. Karie Schwalbe for excision  Female-to-female transgender person Switching to Estradiol patch to reduce VTE risk in the setting of tobacco use She does not smoke daily, so will defer asa 81 mg for now Reduce Spiro to 100 mg daily and add Finasteride 5 mg for hair loss Check  labs at next office visit in approx 2 weeks Begin screening mammogram Q2y at age 48  HTN BP Readings from Last 3 Encounters:  12/21/18 136/83  02/06/18 121/82  10/12/17 130/83  Start Amlodipine 5 mg Counseled on therapeutic lifestyle changes Home BP monitoring Check CMP, TSH, fasting lipids and UA at  next visit. She is not fasting today   Patient education and anticipatory guidance given Patient agrees with treatment plan Follow-up in approx 2 weeks or sooner as needed if symptoms worsen or fail to improve  Darlyne Russian PA-C

## 2018-12-21 NOTE — Patient Instructions (Addendum)
Estrogen patch:  Place on clean, dry skin, preferably on the lower abdomen, upper quadrant of the buttock, or outer aspect of the hip; do not apply to the breasts or waistline; rotate sites of application with 1 week between applications to a particular site   Change patch every 3 days / twice a week   For your blood pressure: - Goal <130/80 (Ideally 120's/70's) - monitor and log blood pressures at home - check around the same time each day in a relaxed setting - Limit salt to <2500 mg/day - Follow DASH (Dietary Approach to Stopping Hypertension) eating plan - Try to get at least 150 minutes of aerobic exercise per week - Aim to go on a brisk walk 30 minutes per day at least 5 days per week. If you're not active, gradually increase how long you walk by 5 minutes each week - limit alcohol: 2 standard drinks per day for men and 1 per day for women - avoid tobacco/nicotine products. Consider smoking cessation if you smoke - weight loss: 7% of current body weight can reduce your blood pressure by 5-10 points - follow-up at least every 6 months for your blood pressure. Follow-up sooner if your BP is not controlled   DASH Eating Plan DASH stands for "Dietary Approaches to Stop Hypertension." The DASH eating plan is a healthy eating plan that has been shown to reduce high blood pressure (hypertension). It may also reduce your risk for type 2 diabetes, heart disease, and stroke. The DASH eating plan may also help with weight loss. What are tips for following this plan?  General guidelines  Avoid eating more than 2,300 mg (milligrams) of salt (sodium) a day. If you have hypertension, you may need to reduce your sodium intake to 1,500 mg a day.  Limit alcohol intake to no more than 1 drink a day for nonpregnant women and 2 drinks a day for men. One drink equals 12 oz of beer, 5 oz of wine, or 1 oz of hard liquor.  Work with your health care provider to maintain a healthy body weight or to lose  weight. Ask what an ideal weight is for you.  Get at least 30 minutes of exercise that causes your heart to beat faster (aerobic exercise) most days of the week. Activities may include walking, swimming, or biking.  Work with your health care provider or diet and nutrition specialist (dietitian) to adjust your eating plan to your individual calorie needs. Reading food labels   Check food labels for the amount of sodium per serving. Choose foods with less than 5 percent of the Daily Value of sodium. Generally, foods with less than 300 mg of sodium per serving fit into this eating plan.  To find whole grains, look for the word "whole" as the first word in the ingredient list. Shopping  Buy products labeled as "low-sodium" or "no salt added."  Buy fresh foods. Avoid canned foods and premade or frozen meals. Cooking  Avoid adding salt when cooking. Use salt-free seasonings or herbs instead of table salt or sea salt. Check with your health care provider or pharmacist before using salt substitutes.  Do not fry foods. Cook foods using healthy methods such as baking, boiling, grilling, and broiling instead.  Cook with heart-healthy oils, such as olive, canola, soybean, or sunflower oil. Meal planning  Eat a balanced diet that includes: ? 5 or more servings of fruits and vegetables each day. At each meal, try to fill half of your plate  with fruits and vegetables. ? Up to 6-8 servings of whole grains each day. ? Less than 6 oz of lean meat, poultry, or fish each day. A 3-oz serving of meat is about the same size as a deck of cards. One egg equals 1 oz. ? 2 servings of low-fat dairy each day. ? A serving of nuts, seeds, or beans 5 times each week. ? Heart-healthy fats. Healthy fats called Omega-3 fatty acids are found in foods such as flaxseeds and coldwater fish, like sardines, salmon, and mackerel.  Limit how much you eat of the following: ? Canned or prepackaged foods. ? Food that is high  in trans fat, such as fried foods. ? Food that is high in saturated fat, such as fatty meat. ? Sweets, desserts, sugary drinks, and other foods with added sugar. ? Full-fat dairy products.  Do not salt foods before eating.  Try to eat at least 2 vegetarian meals each week.  Eat more home-cooked food and less restaurant, buffet, and fast food.  When eating at a restaurant, ask that your food be prepared with less salt or no salt, if possible. What foods are recommended? The items listed may not be a complete list. Talk with your dietitian about what dietary choices are best for you. Grains Whole-grain or whole-wheat bread. Whole-grain or whole-wheat pasta. Brown rice. [place]. Bulgur. Whole-grain and low-sodium cereals. Pita bread. Low-fat, low-sodium crackers. Whole-wheat flour tortillas. Vegetables Fresh or frozen vegetables (raw, steamed, roasted, or grilled). Low-sodium or reduced-sodium tomato and vegetable juice. Low-sodium or reduced-sodium tomato sauce and tomato paste. Low-sodium or reduced-sodium canned vegetables. Fruits All fresh, dried, or frozen fruit. Canned fruit in natural juice (without added sugar). Meat and other protein foods Skinless chicken or [place]. Ground chicken or [place]. Pork with fat trimmed off. Fish and seafood. Egg whites. Dried beans, peas, or lentils. Unsalted nuts, nut butters, and seeds. Unsalted canned beans. Lean cuts of beef with fat trimmed off. Low-sodium, lean deli meat. Dairy Low-fat (1%) or fat-free (skim) milk. Fat-free, low-fat, or reduced-fat cheeses. Nonfat, low-sodium ricotta or cottage cheese. Low-fat or nonfat yogurt. Low-fat, low-sodium cheese. Fats and oils Soft margarine without trans fats. Vegetable oil. Low-fat, reduced-fat, or light mayonnaise and salad dressings (reduced-sodium). Canola, safflower, olive, soybean, and sunflower oils. Avocado. Seasoning and other foods Herbs. Spices. Seasoning mixes without salt. Unsalted  popcorn and pretzels. Fat-free sweets. What foods are not recommended? The items listed may not be a complete list. Talk with your dietitian about what dietary choices are best for you. Grains Baked goods made with fat, such as croissants, muffins, or some breads. Dry pasta or rice meal packs. Vegetables Creamed or fried vegetables. Vegetables in a cheese sauce. Regular canned vegetables (not low-sodium or reduced-sodium). Regular canned tomato sauce and paste (not low-sodium or reduced-sodium). Regular tomato and vegetable juice (not low-sodium or reduced-sodium). Angie Fava. Olives. Fruits Canned fruit in a light or heavy syrup. Fried fruit. Fruit in cream or butter sauce. Meat and other protein foods Fatty cuts of meat. Ribs. Fried meat. Berniece Salines. Sausage. Bologna and other processed lunch meats. Salami. Fatback. Hotdogs. Bratwurst. Salted nuts and seeds. Canned beans with added salt. Canned or smoked fish. Whole eggs or egg yolks. Chicken or [place] with skin. Dairy Whole or 2% milk, cream, and half-and-half. Whole or full-fat cream cheese. Whole-fat or sweetened yogurt. Full-fat cheese. Nondairy creamers. Whipped toppings. Processed cheese and cheese spreads. Fats and oils Butter. Stick margarine. Lard. Shortening. Ghee. Bacon fat. Tropical oils, such as coconut,  palm kernel, or palm oil. Seasoning and other foods Salted popcorn and pretzels. Onion salt, garlic salt, seasoned salt, table salt, and sea salt. Worcestershire sauce. Tartar sauce. Barbecue sauce. Teriyaki sauce. Soy sauce, including reduced-sodium. Steak sauce. Canned and packaged gravies. Fish sauce. Oyster sauce. Cocktail sauce. Horseradish that you find on the shelf. Ketchup. Mustard. Meat flavorings and tenderizers. Bouillon cubes. Hot sauce and Tabasco sauce. Premade or packaged marinades. Premade or packaged taco seasonings. Relishes. Regular salad dressings. Where to find more information:  National Heart, Lung, and [place]: https://wilson-eaton.com/  American Heart Association: www.heart.org Summary  The DASH eating plan is a healthy eating plan that has been shown to reduce high blood pressure (hypertension). It may also reduce your risk for type 2 diabetes, heart disease, and stroke.  With the DASH eating plan, you should limit salt (sodium) intake to 2,300 mg a day. If you have hypertension, you may need to reduce your sodium intake to 1,500 mg a day.  When on the DASH eating plan, aim to eat more fresh fruits and vegetables, whole grains, lean proteins, low-fat dairy, and heart-healthy fats.  Work with your health care provider or diet and nutrition specialist (dietitian) to adjust your eating plan to your individual calorie needs. This information is not intended to replace advice given to you by your health care provider. Make sure you discuss any questions you have with your health care provider. Document Released: 04/15/2011 Document Revised: 04/08/2017 Document Reviewed: 04/19/2016 Elsevier Patient Education  2020 [employer].

## 2018-12-27 ENCOUNTER — Other Ambulatory Visit: Payer: Self-pay

## 2018-12-27 ENCOUNTER — Ambulatory Visit (INDEPENDENT_AMBULATORY_CARE_PROVIDER_SITE_OTHER): Payer: BLUE CROSS/BLUE SHIELD

## 2018-12-27 DIAGNOSIS — R2242 Localized swelling, mass and lump, left lower limb: Secondary | ICD-10-CM

## 2019-01-02 DIAGNOSIS — R2242 Localized swelling, mass and lump, left lower limb: Secondary | ICD-10-CM | POA: Insufficient documentation

## 2019-01-05 ENCOUNTER — Ambulatory Visit (INDEPENDENT_AMBULATORY_CARE_PROVIDER_SITE_OTHER): Payer: BLUE CROSS/BLUE SHIELD | Admitting: Sports Medicine

## 2019-01-05 ENCOUNTER — Ambulatory Visit (INDEPENDENT_AMBULATORY_CARE_PROVIDER_SITE_OTHER): Payer: BLUE CROSS/BLUE SHIELD | Admitting: Physician Assistant

## 2019-01-05 ENCOUNTER — Encounter: Payer: Self-pay | Admitting: Sports Medicine

## 2019-01-05 ENCOUNTER — Other Ambulatory Visit: Payer: Self-pay

## 2019-01-05 ENCOUNTER — Encounter: Payer: Self-pay | Admitting: Physician Assistant

## 2019-01-05 DIAGNOSIS — R7989 Other specified abnormal findings of blood chemistry: Secondary | ICD-10-CM | POA: Diagnosis not present

## 2019-01-05 DIAGNOSIS — I10 Essential (primary) hypertension: Secondary | ICD-10-CM | POA: Diagnosis not present

## 2019-01-05 DIAGNOSIS — E349 Endocrine disorder, unspecified: Secondary | ICD-10-CM

## 2019-01-05 DIAGNOSIS — Z789 Other specified health status: Secondary | ICD-10-CM

## 2019-01-05 DIAGNOSIS — L720 Epidermal cyst: Secondary | ICD-10-CM

## 2019-01-05 DIAGNOSIS — Z13220 Encounter for screening for lipoid disorders: Secondary | ICD-10-CM

## 2019-01-05 DIAGNOSIS — Z5181 Encounter for therapeutic drug level monitoring: Secondary | ICD-10-CM | POA: Diagnosis not present

## 2019-01-05 DIAGNOSIS — F640 Transsexualism: Secondary | ICD-10-CM

## 2019-01-05 DIAGNOSIS — IMO0001 Reserved for inherently not codable concepts without codable children: Secondary | ICD-10-CM

## 2019-01-05 MED ORDER — ASPIRIN EC 81 MG PO TBEC: 81.0000 mg | DELAYED_RELEASE_TABLET | Freq: Every day | ORAL | 3 refills | Status: AC

## 2019-01-05 MED ORDER — ESTRADIOL 2 MG PO TABS: 2.0000 mg | ORAL_TABLET | Freq: Two times a day (BID) | ORAL | 0 refills | Status: DC

## 2019-01-05 MED ORDER — HYDROCODONE-ACETAMINOPHEN 5-325 MG PO TABS: 1.0000 | ORAL_TABLET | Freq: Three times a day (TID) | ORAL | 0 refills | Status: DC | PRN

## 2019-01-05 NOTE — Progress Notes (Signed)
Subjective:    CC: Painful mass  HPI: For years this pleasant 48 year old female has had a painful lump on the back of her left upper thigh, occasionally red, hot, draining.  Moderate, persistent, localized without radiation.  I reviewed the past medical history, family history, social history, surgical history, and allergies today and no changes were needed.  Please see the problem list section below in epic for further details.  Past Medical History: Past Medical History:  Diagnosis Date  . Bipolar disorder ([place])   . 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 History of shingles 03/2017   right flank  . IBS (irritable bowel syndrome)   . Low serum thyroid stimulating hormone (TSH) 09/21/2017  . Tobacco use    Past Surgical History: Past Surgical History:  Procedure Laterality Date  . CHOLECYSTECTOMY    . INJECTION OF SILICONE OIL     Social History: Social History   Socioeconomic History  . Marital status: Soil scientist    Spouse name: Dominik  . Number of children: Not on file  . Years of education: Not on file  . Highest education level: Not on file  Occupational History  . Not on file  Social Needs  . Financial resource strain: Not on file  . Food insecurity    Worry: Not on file    Inability: Not on file  . Transportation needs    Medical: Not on file    Non-medical: Not on file  Tobacco Use  . Smoking status: Former Smoker    Packs/day: 0.50    Years: 20.00    Pack years: 10.00    Types: Cigarettes    Quit date: 09/16/2017    Years since quitting: 1.3  . Smokeless tobacco: Never Used  Substance and Sexual Activity  . Alcohol use: Not Currently    Alcohol/week: 1.0 - 2.0 standard drinks    Types: 1 - 2 Standard drinks or equivalent per week  . Drug use: Yes    Types: Marijuana  . Sexual activity: Yes    Birth control/protection: None  Lifestyle  . Physical activity    Days per week: Not on file    Minutes per session: Not on file  . Stress: Not on  file  Relationships  . Social Herbalist on phone: Not on file    Gets together: Not on file    Attends religious service: Not on file    Active member of club or organization: Not on file    Attends meetings of clubs or organizations: Not on file    Relationship status: Not on file  Other Topics Concern  . Not on file  Social History Narrative  . Not on file   Family History: Family History  Problem Relation Age of Onset  . Stroke Maternal Grandmother   . Skin cancer Maternal Grandmother   . Heart attack Maternal Grandfather   . Valvular heart disease Maternal Grandfather   . Diabetes Maternal Grandfather   . Diabetes Paternal Grandmother   . Stroke Paternal Grandfather   . Breast cancer Paternal Aunt   . Leukemia Other    Allergies: No Known Allergies Medications: See med rec.  Review of Systems: No fevers, chills, night sweats, weight loss, chest pain, or shortness of breath.   Objective:    General: Well Developed, well nourished, and in no acute distress.  Neuro: Alert and oriented x3, extra-ocular muscles intact, sensation grossly intact.  HEENT: Normocephalic, atraumatic, pupils equal round  reactive to light, neck supple, no masses, no lymphadenopathy, thyroid nonpalpable.  Skin: Warm and dry, no rashes. Cardiac: Regular rate and rhythm, no murmurs rubs or gallops, no lower extremity edema.  Respiratory: Clear to auscultation bilaterally. Not using accessory muscles, speaking in full sentences. Left thigh: There is a palpable 2 cm mass in the subcutaneous tissues.  Procedure:  Excision of 2 cm left posterior upper thigh subcutaneous mass Risks, benefits, and alternatives explained and consent obtained. Time out conducted. Surface prepped with alcohol. 10cc lidocaine with epinephine infiltrated in a field block. Adequate anesthesia ensured. Area prepped and draped in a sterile fashion. Excision performed with: Using a #10 blade I made an elliptical  incision around the overlying skin, then using sharp and blunt dissection I was able to remove the mass en bloc, it appeared to be a dermoid cyst/sebaceous cyst, it was removed with its intact capsule.  I then used 5 simple interrupted 3-0 Ethilon sutures to close the incision. Hemostasis achieved. Pt stable.  Impression and Recommendations:    Epidermoid cyst of skin I removed a 2 cm subcutaneous tumor from the left posterior thigh at the inferior gluteal crease. Return in 2 weeks for suture removal, hydrocodone for postoperative pain.   ___________________________________________ Ihor Austinhomas J. Benjamin Stainhekkekandam, M.D., ABFM., CAQSM. Primary Care and Sports Medicine [facility]  Adjunct Professor of Family Medicine  [employer]

## 2019-01-05 NOTE — Assessment & Plan Note (Signed)
I removed a 2 cm subcutaneous tumor from the left posterior thigh at the inferior gluteal crease. Return in 2 weeks for suture removal, hydrocodone for postoperative pain.

## 2019-01-05 NOTE — Progress Notes (Signed)
HPI:                                                                Holly Walker is a 48 y.o. adult [facility] today for medication management  HTN: taking Amlodipine 5 mg daily; started 2 weeks ago. Compliant with medications. Does not check BP's at home.  Denies vision change, headache, chest pain with exertion, orthopnea, lightheadedness, syncope and edema. Risk factors include: tobacco use, obesity, family hx   HRT: has been on and off feminizing hormones for 8 years. She has been on low-dose oral estradiol 2 mg once a day and spironolactone 200 mg daily since June 2019. No concerns apart from hair thinning, which she treats with OTC Minoxidil. She does occasionally smoke cigarettes socially. She has a history of silicone breast injections and lumpy breasts, so she underwent diagnostic mammo 10/2017, which was negative. At last OV on 12/21/18, changed to Estradiol patch 0.1 mg twice weekly, reduced Cleda DaubSpiro to 100 mg and added Finasteride 5 mg for hair loss Unfortunately patch is expensive and falling off   Past Medical History:  Diagnosis Date  . Bipolar disorder ([facility])   . 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 History of shingles 03/2017   right flank  . IBS (irritable bowel syndrome)   . Low serum thyroid stimulating hormone (TSH) 09/21/2017  . Tobacco use    Past Surgical History:  Procedure Laterality Date  . CHOLECYSTECTOMY    . INJECTION OF SILICONE OIL     Social History   Tobacco Use  . Smoking status: Current Some Day Smoker    Packs/day: 0.50    Years: 20.00    Pack years: 10.00    Types: Cigarettes    Last attempt to quit: 09/16/2017    Years since quitting: 1.3  . Smokeless tobacco: Never Used  Substance Use Topics  . Alcohol use: Not Currently    Alcohol/week: 1.0 - 2.0 standard drinks    Types: 1 - 2 Standard drinks or equivalent per week   family history includes Breast cancer in her paternal  aunt; Diabetes in her maternal grandfather and paternal grandmother; Heart attack in her maternal grandfather; Leukemia in an other family member; Skin cancer in her maternal grandmother; Stroke in her maternal grandmother and paternal grandfather; Valvular heart disease in her maternal grandfather.    ROS: negative except as noted in the HPI  Medications: Current Outpatient Medications  Medication Sig Dispense Refill  . amLODipine (NORVASC) 5 MG tablet Take 1 tablet (5 mg total) by mouth daily. 90 tablet 1  . aspirin EC 81 MG tablet Take 1 tablet (81 mg total) by mouth daily. 90 tablet 3  . estradiol (ESTRACE) 2 MG tablet Take 1 tablet (2 mg total) by mouth 2 (two) times daily. TAKE 1 TABLET(2 MG) BY MOUTH DAILY 180 tablet 0  . finasteride (PROSCAR) 5 MG tablet Take 1 tablet (5 mg total) by mouth daily. 90 tablet 1  . HYDROcodone-acetaminophen (NORCO/VICODIN) 5-325 MG tablet Take 1 tablet by mouth every 8 (eight) hours as needed for moderate pain. 15 tablet 0  . Minoxidil 5 % FOAM Apply 1 mL topically 2 (two) times daily. 60 g 1  .  spironolactone (ALDACTONE) 100 MG tablet Take 1 tablet (100 mg total) by mouth daily. 90 tablet 1   No current facility-administered medications for this visit.    No Known Allergies     Objective:  There were no vitals taken for this visit.  Wt Readings from Last 3 Encounters:  01/05/19 257 lb (116.6 kg)  12/21/18 255 lb (115.7 kg)  02/06/18 257 lb (116.6 kg)   Temp Readings from Last 3 Encounters:  12/21/18 98.2 F (36.8 C) (Oral)   BP Readings from Last 3 Encounters:  01/05/19 125/83  12/21/18 136/83  02/06/18 121/82   Pulse Readings from Last 3 Encounters:  01/05/19 88  12/21/18 97  02/06/18 76    Gen:  alert, not ill-appearing, no distress, appropriate for age, obese adult HEENT: head normocephalic without obvious abnormality, conjunctiva and cornea clear, trachea midline Pulm: Normal work of breathing, normal phonation, clear to  auscultation bilaterally, no wheezes, rales or rhonchi CV: Normal rate, regular rhythm, s1 and s2 distinct, no murmurs, clicks or rubs  Neuro: alert and oriented x 3, no tremor MSK: extremities atraumatic, normal gait and station, no peripheral edema Skin: intact, no rashes on exposed skin, no jaundice, no cyanosis Psych: well-groomed, cooperative, good eye contact, euthymic mood, affect mood-congruent, speech is articulate, and thought processes clear and goal-directed  Lab Results  Component Value Date   CREATININE 0.75 01/05/2019   BUN 12 01/05/2019   NA 136 01/05/2019   K 4.5 01/05/2019   CL 107 01/05/2019   CO2 23 01/05/2019   Lab Results  Component Value Date   ALT 22 01/05/2019   AST 23 01/05/2019   BILITOT 0.6 01/05/2019   Lab Results  Component Value Date   WBC 6.0 01/05/2019   HGB 17.1 01/05/2019   HCT 49.4 01/05/2019   MCV 97.1 01/05/2019   PLT 179 01/05/2019   Lab Results  Component Value Date   CHOL 156 01/05/2019   HDL 35 (L) 01/05/2019   LDLCALC 96 01/05/2019   TRIG 146 01/05/2019   CHOLHDL 4.5 01/05/2019   The 10-year ASCVD risk score Mikey Bussing DC Jr., et al., 2013) is: 6.7%   Values used to calculate the score:     Age: 42 years     Sex: Female     Is Non-Hispanic African American: No     Diabetic: No     Tobacco smoker: Yes     Systolic Blood Pressure: 710 mmHg     Is BP treated: Yes     HDL Cholesterol: 35 mg/dL     Total Cholesterol: 156 mg/dL   Assessment and Plan: 48 y.o. adult with   .Diagnoses and all orders for this visit:  Low serum thyroid stimulating hormone (TSH) -     Lipid Panel w/reflex Direct LDL -     CBC -     COMPLETE METABOLIC PANEL WITH GFR -     Testosterone -     TSH + free T4 -     Estradiol  Endocrine disorder -     estradiol (ESTRACE) 2 MG tablet; Take 1 tablet (2 mg total) by mouth 2 (two) times daily. 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  Hypertension goal BP (blood pressure) < 130/80 -     Lipid Panel w/reflex Direct LDL -     CBC -     COMPLETE METABOLIC PANEL WITH GFR -     Testosterone -     TSH + free T4 -     Estradiol -     aspirin EC 81 MG tablet; Take 1 tablet (81 mg total) by mouth daily.  Screening for lipid disorders -     Lipid Panel w/reflex Direct LDL    Female-to-female transgender person Unfortunately Estradiol patch is too expensive, so  to reduce VTE risk in the setting of tobacco use recommend she start baby asa and limit oral estradiol to 4 mg bid Begin screening mammogram Q2y at age 19  HTN BP Readings from Last 3 Encounters:  01/05/19 125/83  12/21/18 136/83  02/06/18 121/82  10 yr ASCVD risk 6.7% Cont Amlodipine 5 mg Start baby asa for primary prevention Counseled on therapeutic lifestyle changes, including smoking cessation Home BP monitoring Check CMP, TSH, fasting lipids and UA at next visit. She is not fasting today   Patient education and anticipatory guidance given Patient agrees with treatment plan Follow-up in 6 months for med mgmt or sooner as needed if symptoms worsen or fail to improve  Levonne Hubert PA-C

## 2019-01-05 NOTE — Patient Instructions (Signed)
Incision Care, Adult °An incision is a surgical cut that is made through your skin. Most incisions are closed after surgery. Your incision may be closed with stitches (sutures), staples, skin glue, or adhesive strips. You may need to return to your health care provider to have sutures or staples removed. This may occur several days to several weeks after your surgery. The incision needs to be cared for properly to prevent infection. °How to care for your incision °Incision care ° °· Follow instructions from your health care provider about how to take care of your incision. Make sure you: °? Wash your hands with soap and water before you change the bandage (dressing). If soap and water are not available, use hand sanitizer. °? Change your dressing as told by your health care provider. °? Leave sutures, skin glue, or adhesive strips in place. These skin closures may need to stay in place for 2 weeks or longer. If adhesive strip edges start to loosen and curl up, you may trim the loose edges. Do not remove adhesive strips completely unless your health care provider tells you to do that. °· Check your incision area every day for signs of infection. Check for: °? More redness, swelling, or pain. °? More fluid or blood. °? Warmth. °? Pus or a bad smell. °· Ask your health care provider how to clean the incision. This may include: °? Using mild soap and water. °? Using a clean towel to pat the incision dry after cleaning it. °? Applying a cream or ointment. Do this only as told by your health care provider. °? Covering the incision with a clean dressing. °· Ask your health care provider when you can leave the incision uncovered. °· Do not take baths, swim, or use a hot tub until your health care provider approves. Ask your health care provider if you can take showers. You may only be allowed to take sponge baths for bathing. °Medicines °· If you were prescribed an antibiotic medicine, cream, or ointment, take or apply the  antibiotic as told by your health care provider. Do not stop taking or applying the antibiotic even if your condition improves. °· Take over-the-counter and prescription medicines only as told by your health care provider. °General instructions °· Limit movement around your incision to improve healing. °? Avoid straining, lifting, or exercise for the first month, or for as long as told by your health care provider. °? Follow instructions from your health care provider about returning to your normal activities. °? Ask your health care provider what activities are safe. °· Protect your incision from the sun when you are outside for the first 6 months, or for as long as told by your health care provider. Apply sunscreen around the scar or cover it up. °· Keep all follow-up visits as told by your health care provider. This is important. °Contact a health care provider if: °· Your have more redness, swelling, or pain around the incision. °· You have more fluid or blood coming from the incision. °· Your incision feels warm to the touch. °· You have pus or a bad smell coming from the incision. °· You have a fever or shaking chills. °· You are nauseous or you vomit. °· You are dizzy. °· Your sutures or staples come undone. °Get help right away if: °· You have a red streak coming from your incision. °· Your incision bleeds through the dressing and the bleeding does not stop with gentle pressure. °· The edges of   your incision open up and separate. °· You have severe pain. °· You have a rash. °· You are confused. °· You faint. °· You have trouble breathing and a fast heartbeat. °This information is not intended to replace advice given to you by your health care provider. Make sure you discuss any questions you have with your health care provider. °Document Released: 11/13/2004 Document Revised: 04/28/2018 Document Reviewed: 11/12/2015 °Elsevier Patient Education © 2020 [employer] ° °

## 2019-01-06 LAB — LIPID PANEL W/REFLEX DIRECT LDL
Cholesterol: 156 mg/dL (ref <200)
HDL: 35 mg/dL — ABNORMAL LOW (ref >=40)
LDL Cholesterol (Calc): 96 mg/dL (calc)
Non-HDL Cholesterol (Calc): 121 mg/dL (calc) (ref <130)
Total CHOL/HDL Ratio: 4.5 (calc) (ref <5.0)
Triglycerides: 146 mg/dL (ref <150)

## 2019-01-06 LAB — COMPLETE METABOLIC PANEL WITH GFR
AG Ratio: 1.4 (calc) (ref 1.0–2.5)
ALT: 22 U/L (ref 9–46)
AST: 23 U/L (ref 10–40)
Albumin: 4.5 g/dL (ref 3.6–5.1)
Alkaline phosphatase (APISO): 76 U/L (ref 36–130)
BUN: 12 mg/dL (ref 7–25)
CO2: 23 mmol/L (ref 20–32)
Calcium: 9.3 mg/dL (ref 8.6–10.3)
Chloride: 107 mmol/L (ref 98–110)
Creat: 0.75 mg/dL (ref 0.60–1.35)
GFR, Est African American: 128 mL/min/{1.73_m2} (ref >=60)
GFR, Est Non African American: 110 mL/min/{1.73_m2} (ref >=60)
Globulin: 3.2 g/dL (calc) (ref 1.9–3.7)
Glucose, Bld: 101 mg/dL — ABNORMAL HIGH (ref 65–99)
Potassium: 4.5 mmol/L (ref 3.5–5.3)
Sodium: 136 mmol/L (ref 135–146)
Total Bilirubin: 0.6 mg/dL (ref 0.2–1.2)
Total Protein: 7.7 g/dL (ref 6.1–8.1)

## 2019-01-06 LAB — CBC
HCT: 49.4 % (ref 38.5–50.0)
Hemoglobin: 17.1 g/dL (ref 13.2–17.1)
MCH: 33.6 pg — ABNORMAL HIGH (ref 27.0–33.0)
MCHC: 34.6 g/dL (ref 32.0–36.0)
MCV: 97.1 fL (ref 80.0–100.0)
MPV: 10.2 fL (ref 7.5–12.5)
Platelets: 179 10*3/uL (ref 140–400)
RBC: 5.09 10*6/uL (ref 4.20–5.80)
RDW: 12.7 % (ref 11.0–15.0)
WBC: 6 10*3/uL (ref 3.8–10.8)

## 2019-01-06 LAB — TESTOSTERONE: Testosterone: 295 ng/dL (ref 250–827)

## 2019-01-06 LAB — TSH+FREE T4: TSH W/REFLEX TO FT4: 0.74 mIU/L (ref 0.40–4.50)

## 2019-01-06 LAB — ESTRADIOL: Estradiol: 87 pg/mL — ABNORMAL HIGH (ref <=39)

## 2019-01-08 ENCOUNTER — Encounter: Payer: Self-pay | Admitting: Physician Assistant

## 2019-01-08 DIAGNOSIS — R748 Abnormal levels of other serum enzymes: Secondary | ICD-10-CM | POA: Insufficient documentation

## 2019-01-28 ENCOUNTER — Encounter: Payer: Self-pay | Admitting: Physician Assistant

## 2019-04-25 ENCOUNTER — Other Ambulatory Visit: Payer: Self-pay | Admitting: *Deleted

## 2019-04-25 DIAGNOSIS — E349 Endocrine disorder, unspecified: Secondary | ICD-10-CM

## 2019-04-25 MED ORDER — ESTRADIOL 2 MG PO TABS: 2.0000 mg | ORAL_TABLET | Freq: Two times a day (BID) | ORAL | 0 refills | Status: DC

## 2019-06-08 ENCOUNTER — Telehealth: Payer: Self-pay | Admitting: Osteopathic Medicine

## 2019-06-08 ENCOUNTER — Other Ambulatory Visit: Payer: Self-pay

## 2019-06-08 DIAGNOSIS — E349 Endocrine disorder, unspecified: Secondary | ICD-10-CM

## 2019-06-08 DIAGNOSIS — I10 Essential (primary) hypertension: Secondary | ICD-10-CM

## 2019-06-08 DIAGNOSIS — IMO0001 Reserved for inherently not codable concepts without codable children: Secondary | ICD-10-CM

## 2019-06-08 DIAGNOSIS — L659 Nonscarring hair loss, unspecified: Secondary | ICD-10-CM

## 2019-06-08 DIAGNOSIS — Z789 Other specified health status: Secondary | ICD-10-CM

## 2019-06-08 MED ORDER — AMLODIPINE BESYLATE 5 MG PO TABS: 5.0000 mg | ORAL_TABLET | Freq: Every day | ORAL | 0 refills | Status: DC

## 2019-06-08 MED ORDER — SPIRONOLACTONE 100 MG PO TABS: 100.0000 mg | ORAL_TABLET | Freq: Every day | ORAL | 0 refills | Status: DC

## 2019-06-08 MED ORDER — FINASTERIDE 5 MG PO TABS: 5.0000 mg | ORAL_TABLET | Freq: Every day | ORAL | 0 refills | Status: DC

## 2019-06-08 NOTE — Telephone Encounter (Signed)
Task completed. Pt has been updated and is aware meds sent to pharmacy on file. Pt has confirmed to keep upcoming appt to transition care with Dr. Lyn Hollingshead. No other inquiries during call.

## 2019-06-08 NOTE — Telephone Encounter (Signed)
PT needs a refill on medications. Appointment made on 2/17 for med follow up.   Please advise.  amLODiphine 5MG  Finasteride 5MG  Spironolactone 100MG 

## 2019-06-15 ENCOUNTER — Other Ambulatory Visit: Payer: Self-pay | Admitting: Physician Assistant

## 2019-06-15 ENCOUNTER — Other Ambulatory Visit: Payer: Self-pay

## 2019-06-15 ENCOUNTER — Ambulatory Visit: Payer: BLUE CROSS/BLUE SHIELD | Admitting: Osteopathic Medicine

## 2019-06-15 DIAGNOSIS — E349 Endocrine disorder, unspecified: Secondary | ICD-10-CM

## 2019-06-15 DIAGNOSIS — IMO0001 Reserved for inherently not codable concepts without codable children: Secondary | ICD-10-CM

## 2019-06-15 DIAGNOSIS — Z789 Other specified health status: Secondary | ICD-10-CM

## 2019-06-15 DIAGNOSIS — L659 Nonscarring hair loss, unspecified: Secondary | ICD-10-CM

## 2019-06-15 DIAGNOSIS — I10 Essential (primary) hypertension: Secondary | ICD-10-CM

## 2019-06-15 MED ORDER — FINASTERIDE 5 MG PO TABS: 5.0000 mg | ORAL_TABLET | Freq: Every day | ORAL | 0 refills | Status: DC

## 2019-06-15 MED ORDER — SPIRONOLACTONE 100 MG PO TABS: 100.0000 mg | ORAL_TABLET | Freq: Every day | ORAL | 0 refills | Status: DC

## 2019-06-15 MED ORDER — AMLODIPINE BESYLATE 5 MG PO TABS: 5.0000 mg | ORAL_TABLET | Freq: Every day | ORAL | 0 refills | Status: DC

## 2019-06-27 ENCOUNTER — Ambulatory Visit: Payer: BLUE CROSS/BLUE SHIELD | Admitting: Osteopathic Medicine

## 2019-07-05 ENCOUNTER — Ambulatory Visit (INDEPENDENT_AMBULATORY_CARE_PROVIDER_SITE_OTHER): Payer: 59 | Admitting: Osteopathic Medicine

## 2019-07-05 ENCOUNTER — Other Ambulatory Visit: Payer: Self-pay

## 2019-07-05 ENCOUNTER — Encounter: Payer: Self-pay | Admitting: Osteopathic Medicine

## 2019-07-05 VITALS — BP 134/86 | HR 93 | Temp 97.7°F | Wt 252.1 lb

## 2019-07-05 DIAGNOSIS — E349 Endocrine disorder, unspecified: Secondary | ICD-10-CM

## 2019-07-05 DIAGNOSIS — IMO0001 Reserved for inherently not codable concepts without codable children: Secondary | ICD-10-CM

## 2019-07-05 DIAGNOSIS — L659 Nonscarring hair loss, unspecified: Secondary | ICD-10-CM

## 2019-07-05 DIAGNOSIS — F640 Transsexualism: Secondary | ICD-10-CM

## 2019-07-05 DIAGNOSIS — I10 Essential (primary) hypertension: Secondary | ICD-10-CM | POA: Diagnosis not present

## 2019-07-05 DIAGNOSIS — Z789 Other specified health status: Secondary | ICD-10-CM

## 2019-07-05 DIAGNOSIS — Z23 Encounter for immunization: Secondary | ICD-10-CM

## 2019-07-05 MED ORDER — SPIRONOLACTONE 100 MG PO TABS: 100.0000 mg | ORAL_TABLET | Freq: Every day | ORAL | 1 refills | Status: DC

## 2019-07-05 MED ORDER — DIAZEPAM 5 MG PO TABS: 2.5000 mg | ORAL_TABLET | Freq: Three times a day (TID) | ORAL | 1 refills | Status: DC | PRN

## 2019-07-05 MED ORDER — DIAZEPAM 5 MG PO TABS: 2.5000 mg | ORAL_TABLET | Freq: Three times a day (TID) | ORAL | 1 refills | Status: AC | PRN

## 2019-07-05 MED ORDER — MINOXIDIL 5 % EX FOAM: 1.0000 mL | Freq: Two times a day (BID) | CUTANEOUS | 11 refills | Status: DC

## 2019-07-05 MED ORDER — AMLODIPINE BESYLATE 5 MG PO TABS: 5.0000 mg | ORAL_TABLET | Freq: Every day | ORAL | 3 refills | Status: DC

## 2019-07-05 MED ORDER — FINASTERIDE 5 MG PO TABS: 5.0000 mg | ORAL_TABLET | Freq: Every day | ORAL | 3 refills | Status: DC

## 2019-07-05 MED ORDER — ESTRADIOL 2 MG PO TABS: 2.0000 mg | ORAL_TABLET | Freq: Two times a day (BID) | ORAL | 1 refills | Status: DC

## 2019-07-05 NOTE — Progress Notes (Signed)
HPI: Holly Walker is a 49 y.o. 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  she presents to [facility] today, 07/05/19,  for chief complaint of: Establish care Maintain estrogen - transgender   Super nice lady here to establish care, previous PCP has left the practice. She's doing well overall, due for labs.  Very occsaional anxiety issues, especially w/ family health issues recently - would like refill of Valium for sparing use    Past medical, surgical, social and family history reviewed:  Patient Active Problem List   Diagnosis Date Noted  . 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  . Epidermoid cyst of skin 02/15/2018  . Hair loss 02/15/2018  . 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 Hypertension goal BP (blood pressure) < 130/80 10/12/2017  . Breast lump or mass 09/29/2017  . Endocrine disorder 09/20/2017  . Marijuana smoker, episodic 09/20/2017  . Bipolar disorder (HCC)   . IBS (irritable bowel syndrome)   . History of shingles 03/10/2017    Past Surgical History:  Procedure Laterality Date  . CHOLECYSTECTOMY    . INJECTION OF SILICONE OIL      Social History   Tobacco Use  . Smoking status: Current Some Day Smoker    Packs/day: 0.50    Years: 20.00    Pack years: 10.00    Types: Cigarettes    Last attempt to quit: 09/16/2017    Years since quitting: 1.8  . Smokeless tobacco: Never Used  Substance Use Topics  . Alcohol use: Not Currently    Alcohol/week: 1.0 - 2.0 standard drinks    Types: 1 - 2 Standard drinks or equivalent per week    Family History  Problem Relation Age of Onset  . Stroke Maternal Grandmother   . Skin cancer Maternal Grandmother   . Heart attack Maternal Grandfather   . Valvular heart disease  Maternal Grandfather   . Diabetes Maternal Grandfather   . Diabetes Paternal Grandmother   . Stroke Paternal Grandfather   . Breast cancer Paternal Aunt   . Leukemia Other      Current medication list and allergy/intolerance information reviewed:    Current Outpatient Medications  Medication Sig Dispense Refill  . amLODipine (NORVASC) 5 MG tablet Take 1 tablet (5 mg total) by mouth daily. 15 tablet 0  . aspirin EC 81 MG tablet Take 1 tablet (81 mg total) by mouth daily. 90 tablet 3  . estradiol (ESTRACE) 2 MG tablet TAKE ONE TABLET TWICE DAILY 180 tablet 0  . finasteride (PROSCAR) 5 MG tablet Take 1 tablet (5 mg total) by mouth daily. 15 tablet 0  . HYDROcodone-acetaminophen (NORCO/VICODIN) 5-325 MG tablet Take 1 tablet by mouth every 8 (eight) hours as needed for moderate pain. 15 tablet 0  . Minoxidil 5 % FOAM Apply 1 mL topically 2 (two) times daily. 60 g 1  . spironolactone (ALDACTONE) 100 MG tablet Take 1 tablet (100 mg total) by mouth daily. 15 tablet 0   No current facility-administered medications for this visit.    No Known Allergies      Exam:  BP 134/86 (BP Location: Left Arm, Patient Position: Sitting, Cuff Size: Normal)   Pulse 93   Temp 97.7 F (36.5 C) (Oral)   Wt 252 lb 1.9 oz (114.4 kg)   BMI  38.33 kg/m   Constitutional: VS see above. General Appearance: alert, well-developed, well-nourished, NAD  Eyes: Normal lids and conjunctive, non-icteric sclera  Neck: No masses, trachea midline. No thyroid enlargement.   Respiratory: Normal respiratory effort. no wheeze, no rhonchi, no rales  Cardiovascular: S1/S2 normal, no murmur, no rub/gallop auscultated. RRR. No lower extremity edema.   Musculoskeletal: Gait normal. No clubbing/cyanosis of digits.   Neurological: Normal balance/coordination.   Psychiatric: Normal judgment/insight. Normal mood and affect. Oriented x3.       ASSESSMENT/PLAN: The primary encounter diagnosis was Endocrine disorder.  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   Orders Placed This Encounter  Procedures  . Tdap vaccine greater than or equal to 7yo IM  . CBC  . COMPLETE METABOLIC PANEL WITH GFR  . TSH  . T4, free  . Estradiol  . Testosterone    Meds ordered this encounter  Medications  . amLODipine (NORVASC) 5 MG tablet    Sig: Take 1 tablet (5 mg total) by mouth daily.    Dispense:  90 tablet    Refill:  3  . estradiol (ESTRACE) 2 MG tablet    Sig: Take 1 tablet (2 mg total) by mouth 2 (two) times daily.    Dispense:  180 tablet    Refill:  1  . finasteride (PROSCAR) 5 MG tablet    Sig: Take 1 tablet (5 mg total) by mouth daily.    Dispense:  90 tablet    Refill:  3  . spironolactone (ALDACTONE) 100 MG tablet    Sig: Take 1 tablet (100 mg total) by mouth daily.    Dispense:  90 tablet    Refill:  1  . Minoxidil 5 % FOAM    Sig: Apply 1 mL topically 2 (two) times daily.    Dispense:  60 g    Refill:  11  . DISCONTD: diazepam (VALIUM) 5 MG tablet    Sig: Take 0.5-1 tablets (2.5-5 mg total) by mouth every 8 (eight) hours as needed for anxiety.    Dispense:  15 tablet    Refill:  1  . diazepam (VALIUM) 5 MG tablet    Sig: Take 0.5-1 tablets (2.5-5 mg total) by mouth every 8 (eight) hours as needed for anxiety.    Dispense:  15 tablet    Refill:  1    Patient Instructions  Weight loss: important things to remember  It is hard work! You will have setbacks, but don't get discouraged. The goal is not short-term success, it is long-term health.   Looking at the numbers is important to track your progress and set goals, but how you are feeling and your overall health are the most important things! BMI and pounds and calories and miles logged aren't everything - they are tools to help [place] reach your goals.  You can do this!!!   Things to  remember for exercise for weight loss:   Please note - I am not a certified personal trainer. I can present you with ideas and general workout goals, but an exercise program is largely up to you. Find something you can stick with, and something you enjoy!   As you progress in your exercise regimen think about gradually increasing the following, week by week:   intensity (how strenuous is your workout)  frequency (how often you are exercising)  duration (how  many minutes at a time you are exercising)  Walking for 20 minutes a day is certainly better than nothing, but more strenuous exercise will develop better cardiovascular fitness.   interval training (high-intensity alternating with low-intensity, think walk/jog rather than just walk)  muscle strengthening exercises (weight lifting, calisthenics, yoga) - this also helps prevent osteoporosis!   Things to remember for diet changes for weight loss:   Please note - I am not a certified dietician. I can present you with ideas and general diet goals, but a meal plan is largely up to you. I am happy to refer you to a dietician who can give you a detailed meal plan.  Apps/logs are helpful to track how you're eating! It's not realistic to be logging everything you eat forever, but when you're starting a healthy eating lifestyle it's very helpful, and checking in with logs now and then helps you stick to your program!   Calorie restriction with the goal weight loss of no more than one to one and a half pounds per week.   Increase lean protein such as chicken, fish, [place].   Decrease fatty foods such as dairy, butter.   Decrease sugary foods. Avoid sugary drinks such as soda or juice.  Increase fiber found in fruit and vegetables.   Medications approved for long-term use for obesity  Qsymia (Phentermine and Topiramate)  Saxenda (Liraglutide daily) or Ozempic (Semagluitde weekly) injections  Contrave (Bupropion and  Naltrexone)  Orlistat (Xenical, Alli)  Bupropion (Wellbutrin) I recommend that you research the above medications and see which one(s) your insurance may or may not cover: If you call your insurance, ask them specifically what medications are on their formulary that are approved for obesity treatment. They should be able to send you a list or tell you over the phone. Remember, medications aren't magic! You MUST be diligent about lifestyle changes as well!         Visit summary with medication list and pertinent instructions was printed for patient to review. All questions at time of visit were answered - patient instructed to contact office with any additional concerns or updates. ER/RTC precautions were reviewed with the patient.      Please note: voice recognition software was used to produce this document, and typos may escape review. Please contact Dr. Sheppard Coil for any needed clarifications.     Follow-up plan: Return for RECHECK PENDING RESULTS .

## 2019-07-05 NOTE — Patient Instructions (Signed)
Weight loss: important things to remember  It is hard work! You will have setbacks, but don't get discouraged. The goal is not short-term success, it is long-term health.   Looking at the numbers is important to track your progress and set goals, but how you are feeling and your overall health are the most important things! BMI and pounds and calories and miles logged aren't everything - they are tools to help [place] reach your goals.  You can do this!!!   Things to remember for exercise for weight loss:   Please note - I am not a certified personal trainer. I can present you with ideas and general workout goals, but an exercise program is largely up to you. Find something you can stick with, and something you enjoy!   As you progress in your exercise regimen think about gradually increasing the following, week by week:   intensity (how strenuous is your workout)  frequency (how often you are exercising)  duration (how many minutes at a time you are exercising)  Walking for 20 minutes a day is certainly better than nothing, but more strenuous exercise will develop better cardiovascular fitness.   interval training (high-intensity alternating with low-intensity, think walk/jog rather than just walk)  muscle strengthening exercises (weight lifting, calisthenics, yoga) - this also helps prevent osteoporosis!   Things to remember for diet changes for weight loss:   Please note - I am not a certified dietician. I can present you with ideas and general diet goals, but a meal plan is largely up to you. I am happy to refer you to a dietician who can give you a detailed meal plan.  Apps/logs are helpful to track how you're eating! It's not realistic to be logging everything you eat forever, but when you're starting a healthy eating lifestyle it's very helpful, and checking in with logs now and then helps you stick to your program!   Calorie restriction with the goal weight loss of no more than one  to one and a half pounds per week.   Increase lean protein such as chicken, fish, [place].   Decrease fatty foods such as dairy, butter.   Decrease sugary foods. Avoid sugary drinks such as soda or juice.  Increase fiber found in fruit and vegetables.   Medications approved for long-term use for obesity  Qsymia (Phentermine and Topiramate)  Saxenda (Liraglutide daily) or Ozempic (Semagluitde weekly) injections  Contrave (Bupropion and Naltrexone)  Orlistat (Xenical, Alli)  Bupropion (Wellbutrin) I recommend that you research the above medications and see which one(s) your insurance may or may not cover: If you call your insurance, ask them specifically what medications are on their formulary that are approved for obesity treatment. They should be able to send you a list or tell you over the phone. Remember, medications aren't magic! You MUST be diligent about lifestyle changes as well!

## 2019-07-14 LAB — COMPLETE METABOLIC PANEL WITH GFR
AG Ratio: 1.3 (calc) (ref 1.0–2.5)
ALT: 22 U/L (ref 9–46)
AST: 21 U/L (ref 10–40)
Albumin: 4 g/dL (ref 3.6–5.1)
Alkaline phosphatase (APISO): 67 U/L (ref 36–130)
BUN: 12 mg/dL (ref 7–25)
CO2: 25 mmol/L (ref 20–32)
Calcium: 9.4 mg/dL (ref 8.6–10.3)
Chloride: 108 mmol/L (ref 98–110)
Creat: 0.71 mg/dL (ref 0.60–1.35)
GFR, Est African American: 129 mL/min/{1.73_m2} (ref >=60)
GFR, Est Non African American: 112 mL/min/{1.73_m2} (ref >=60)
Globulin: 3.2 g/dL (calc) (ref 1.9–3.7)
Glucose, Bld: 101 mg/dL — ABNORMAL HIGH (ref 65–99)
Potassium: 4.3 mmol/L (ref 3.5–5.3)
Sodium: 139 mmol/L (ref 135–146)
Total Bilirubin: 0.5 mg/dL (ref 0.2–1.2)
Total Protein: 7.2 g/dL (ref 6.1–8.1)

## 2019-07-14 LAB — CBC
HCT: 48.2 % (ref 38.5–50.0)
Hemoglobin: 16.7 g/dL (ref 13.2–17.1)
MCH: 32.9 pg (ref 27.0–33.0)
MCHC: 34.6 g/dL (ref 32.0–36.0)
MCV: 95.1 fL (ref 80.0–100.0)
MPV: 10 fL (ref 7.5–12.5)
Platelets: 147 10*3/uL (ref 140–400)
RBC: 5.07 10*6/uL (ref 4.20–5.80)
RDW: 12.6 % (ref 11.0–15.0)
WBC: 3.7 10*3/uL — ABNORMAL LOW (ref 3.8–10.8)

## 2019-07-14 LAB — ESTRADIOL: Estradiol: 85 pg/mL — ABNORMAL HIGH (ref <=39)

## 2019-07-14 LAB — TESTOSTERONE: Testosterone: 490 ng/dL (ref 250–827)

## 2019-07-14 LAB — TSH: TSH: 0.55 mIU/L (ref 0.40–4.50)

## 2019-07-14 LAB — T4, FREE: Free T4: 1 ng/dL (ref 0.8–1.8)

## 2019-09-05 ENCOUNTER — Encounter: Payer: Self-pay | Admitting: Osteopathic Medicine

## 2019-09-05 ENCOUNTER — Ambulatory Visit (INDEPENDENT_AMBULATORY_CARE_PROVIDER_SITE_OTHER): Payer: 59 | Admitting: Osteopathic Medicine

## 2019-09-05 ENCOUNTER — Other Ambulatory Visit: Payer: Self-pay

## 2019-09-05 ENCOUNTER — Ambulatory Visit: Payer: 59

## 2019-09-05 VITALS — BP 121/79 | HR 78 | Temp 98.0°F | Wt 255.1 lb

## 2019-09-05 DIAGNOSIS — R600 Localized edema: Secondary | ICD-10-CM

## 2019-09-05 DIAGNOSIS — R002 Palpitations: Secondary | ICD-10-CM | POA: Diagnosis not present

## 2019-09-05 NOTE — Progress Notes (Signed)
Holly Walker is a 49 y.o. adult who presents to  [facility] at [facility]  today, 09/05/19, seeking care for the following: . Palpitations past 4 days - feels like heart "buzzes" for an instant then stops. On exam, RRR, normal S1S2, no murmur or gallop appreciated, no rub. Denies CP on exertion, apnea, shortness of breath, dizziness, headache.  LE swelling on R past 4 days -there does appear to be some asymmetry compared to the left lower extremity, right does have trace edema    ASSESSMENT & PLAN with other pertinent history/findings:  The primary encounter diagnosis was Palpitations. A diagnosis of Lower extremity edema was also pertinent to this visit.  1. Palpitations Not really sure how else to characterize these chest sensations, patient feels almost like a buzzing or twinge periodically, when I auscultate the heart she states that she is actively feeling it but I can ascertain no abnormal rhythm, murmur, rub, or other abnormality.  Lungs are clear, chest is nontender.  Description normal sounds like PVCs but nothing was picking up on the EKG, patient states that she was having this symptom with the EKG as well.  Would get echocardiogram for further evaluation and have a low threshold for referral to cardiology.  Since patient was experiencing symptoms while the EKG was running, I do not think a cardiac monitor will be of much use here, but would certainly reconsider if symptoms change/worsen.  Recent labs normal metabolic panel and TSH.  EKG today normal sinus rhythm.  2. 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 US Venous Img Lower Unilateral Right  . EKG 12-Lead  . ECHOCARDIOGRAM COMPLETE       Follow-up instructions: Return for RECHECK PENDING RESULTS / IF WORSE OR  CHANGE.                                         BP 121/79 (BP Location: Left Arm, Patient Position: Sitting, Cuff Size: Large)   Pulse 78   Temp 98 F (36.7 C) (Oral)   Wt 255 lb 1.9 oz (115.7 kg)   BMI 38.79 kg/m   Current Meds  Medication Sig  . amLODipine (NORVASC) 5 MG tablet Take 1 tablet (5 mg total) by mouth daily.  Marland Kitchen aspirin EC 81 MG tablet Take 1 tablet (81 mg total) by mouth daily.  . diazepam (VALIUM) 5 MG tablet Take 0.5-1 tablets (2.5-5 mg total) by mouth every 8 (eight) hours as needed for anxiety.  Marland Kitchen estradiol (ESTRACE) 2 MG tablet Take 1 tablet (2 mg total) by mouth 2 (two) times daily.  . finasteride (PROSCAR) 5 MG tablet Take 1 tablet (5 mg total) by mouth daily.  . Minoxidil 5 % FOAM Apply 1 mL topically 2 (two) times daily.  Marland Kitchen spironolactone (ALDACTONE) 100 MG tablet Take 1 tablet (100 mg total) by mouth daily.    No results found for this or any previous visit (from the past 72 hour(s)).  US Venous Img Lower Unilateral Right  Result Date: 09/05/2019 CLINICAL DATA:  Right lower extremity edema. EXAM: Right LOWER EXTREMITY VENOUS DOPPLER ULTRASOUND TECHNIQUE: Gray-scale sonography with compression, as well as color and duplex ultrasound, were performed to evaluate the deep venous system(s) from the level of the  common femoral vein through the popliteal and proximal calf veins. COMPARISON:  None. FINDINGS: VENOUS Normal compressibility of the common femoral, superficial femoral, and popliteal veins, as well as the visualized calf veins. Visualized portions of profunda femoral vein and great saphenous vein unremarkable. No filling defects to suggest DVT on grayscale or color Doppler imaging. Doppler waveforms show normal direction of venous flow, normal respiratory plasticity and response to augmentation. Limited views of the contralateral common femoral vein are unremarkable. OTHER None. Limitations: none IMPRESSION: Negative.  Electronically Signed   By: Marijo Conception M.D.   On: 09/05/2019 16:30    Depression screen PHQ 2/9 09/14/2017  Decreased Interest 1  Down, Depressed, Hopeless 0  PHQ - 2 Score 1  Altered sleeping 2  Tired, decreased energy 2  Change in appetite 2  Feeling bad or failure about yourself  0  Trouble concentrating 0  Moving slowly or fidgety/restless 0  Suicidal thoughts 0  PHQ-9 Score 7    GAD 7 : Generalized Anxiety Score 09/14/2017  Nervous, Anxious, on Edge 1  Control/stop worrying 1  Worry too much - different things 1  Trouble relaxing 1  Restless 0  Easily annoyed or irritable 1  Afraid - awful might happen 0  Total GAD 7 Score 5      All questions at time of visit were answered - patient instructed to contact office with any additional concerns or updates.  ER/RTC precautions were reviewed with the patient.  Please note: voice recognition software was used to produce this document, and typos may escape review. Please contact Dr. Sheppard Coil for any needed clarifications.

## 2019-09-14 ENCOUNTER — Other Ambulatory Visit: Payer: Self-pay

## 2019-09-14 ENCOUNTER — Ambulatory Visit (HOSPITAL_BASED_OUTPATIENT_CLINIC_OR_DEPARTMENT_OTHER)
Admission: RE | Admit: 2019-09-14 | Discharge: 2019-09-14 | Disposition: A | Discharge status: Home / Self Care | Payer: 59 | Source: Ambulatory Visit | Attending: Osteopathic Medicine | Admitting: Osteopathic Medicine

## 2019-09-14 DIAGNOSIS — R002 Palpitations: Secondary | ICD-10-CM | POA: Insufficient documentation

## 2019-09-14 DIAGNOSIS — I10 Essential (primary) hypertension: Secondary | ICD-10-CM | POA: Diagnosis not present

## 2019-09-14 NOTE — Progress Notes (Signed)
  Echocardiogram 2D Echocardiogram has been performed.  Holly Walker 09/14/2019, 9:53 AM

## 2020-02-11 ENCOUNTER — Ambulatory Visit (INDEPENDENT_AMBULATORY_CARE_PROVIDER_SITE_OTHER): Payer: 59 | Admitting: Osteopathic Medicine

## 2020-02-11 ENCOUNTER — Other Ambulatory Visit: Payer: Self-pay

## 2020-02-11 ENCOUNTER — Encounter: Payer: Self-pay | Admitting: Osteopathic Medicine

## 2020-02-11 VITALS — BP 116/73 | HR 73 | Wt 226.0 lb

## 2020-02-11 DIAGNOSIS — L659 Nonscarring hair loss, unspecified: Secondary | ICD-10-CM

## 2020-02-11 DIAGNOSIS — Z789 Other specified health status: Secondary | ICD-10-CM | POA: Diagnosis not present

## 2020-02-11 DIAGNOSIS — IMO0001 Reserved for inherently not codable concepts without codable children: Secondary | ICD-10-CM

## 2020-02-11 DIAGNOSIS — I10 Essential (primary) hypertension: Secondary | ICD-10-CM

## 2020-02-11 DIAGNOSIS — E349 Endocrine disorder, unspecified: Secondary | ICD-10-CM

## 2020-02-11 DIAGNOSIS — Z113 Encounter for screening for infections with a predominantly sexual mode of transmission: Secondary | ICD-10-CM

## 2020-02-11 MED ORDER — MINOXIDIL 5 % EX FOAM: 1.0000 mL | Freq: Two times a day (BID) | CUTANEOUS | 3 refills | Status: DC

## 2020-02-11 MED ORDER — ESTRADIOL 2 MG PO TABS: 2.0000 mg | ORAL_TABLET | Freq: Two times a day (BID) | ORAL | 3 refills | Status: DC

## 2020-02-11 MED ORDER — SPIRONOLACTONE 100 MG PO TABS: 100.0000 mg | ORAL_TABLET | Freq: Every day | ORAL | 3 refills | Status: DC

## 2020-02-11 MED ORDER — AMLODIPINE BESYLATE 5 MG PO TABS: 5.0000 mg | ORAL_TABLET | Freq: Every day | ORAL | 3 refills | Status: DC

## 2020-02-11 MED ORDER — FINASTERIDE 5 MG PO TABS: 5.0000 mg | ORAL_TABLET | Freq: Every day | ORAL | 3 refills | Status: DC

## 2020-02-11 NOTE — Progress Notes (Signed)
Holly Walker is a 49 y.o. adult who presents to  [facility] at [facility]  today, 02/13/20, seeking care for the following:   BP check  Routine STI screen   Monitor transgender hormone therapy      ASSESSMENT & PLAN with other pertinent findings:  The primary encounter diagnosis was Routine screening for STI (sexually transmitted infection). 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   No results found for this or any previous visit (from the past 24 hour(s)).  There are no Patient Instructions on file for this visit.  Orders Placed This Encounter  Procedures   C. trachomatis/N. gonorrhoeae RNA   Hepatitis B core antibody, total   Hepatitis B surface antigen   HIV Antibody (routine testing w rflx)   RPR   Hepatitis C antibody   CBC   COMPLETE METABOLIC PANEL WITH GFR   Estradiol   Testosterone    Meds ordered this encounter  Medications   spironolactone (ALDACTONE) 100 MG tablet    Sig: Take 1 tablet (100 mg total) by mouth daily.    Dispense:  90 tablet    Refill:  3   finasteride (PROSCAR) 5 MG tablet    Sig: Take 1 tablet (5 mg total) by mouth daily.    Dispense:  90 tablet    Refill:  3   Minoxidil 5 % FOAM    Sig: Apply 1 mL topically 2 (two) times daily.    Dispense:  180 g    Refill:  3   estradiol (ESTRACE) 2 MG tablet    Sig: Take 1 tablet (2 mg total) by mouth 2 (two) times daily.    Dispense:  180 tablet    Refill:  3   amLODipine (NORVASC) 5 MG tablet    Sig: Take 1 tablet (5 mg total) by mouth daily.    Dispense:  90 tablet    Refill:  3       Follow-up instructions: Return in about 6 months (around 08/11/2020) for ANNUAL (call week prior to visit for lab  orders).                                         BP 116/73 (BP Location: Left Arm, Patient Position: Sitting)    Pulse 73    Wt 226 lb (102.5 kg)    SpO2 99%    BMI 34.36 kg/m   Current Meds  Medication Sig   amLODipine (NORVASC) 5 MG tablet Take 1 tablet (5 mg total) by mouth daily.   aspirin EC 81 MG tablet Take 1 tablet (81 mg total) by mouth daily.   estradiol (ESTRACE) 2 MG tablet Take 1 tablet (2 mg total) by mouth 2 (two) times daily.   finasteride (PROSCAR) 5 MG tablet Take 1 tablet (5 mg total) by mouth daily.   Minoxidil 5 % FOAM Apply 1 mL topically 2 (two) times daily.   spironolactone (ALDACTONE) 100 MG tablet Take 1 tablet (100 mg total) by mouth daily.   [DISCONTINUED] amLODipine (NORVASC) 5 MG tablet Take 1 tablet (5 mg total) by mouth daily.   [DISCONTINUED] estradiol (ESTRACE) 2 MG tablet Take 1 tablet (2 mg total) by mouth 2 (two) times daily.   [DISCONTINUED] finasteride (  PROSCAR) 5 MG tablet Take 1 tablet (5 mg total) by mouth daily.   [DISCONTINUED] Minoxidil 5 % FOAM Apply 1 mL topically 2 (two) times daily.   [DISCONTINUED] spironolactone (ALDACTONE) 100 MG tablet Take 1 tablet (100 mg total) by mouth daily.    No results found for this or any previous visit (from the past 72 hour(s)).  No results found.     All questions at time of visit were answered - patient instructed to contact office with any additional concerns or updates.  ER/RTC precautions were reviewed with the patient as applicable.   Please note: voice recognition software was used to produce this document, and typos may escape review. Please contact Dr. Lyn Hollingshead for any needed clarifications.

## 2020-07-18 ENCOUNTER — Other Ambulatory Visit: Payer: Self-pay | Admitting: Osteopathic Medicine

## 2020-07-18 DIAGNOSIS — I10 Essential (primary) hypertension: Secondary | ICD-10-CM

## 2020-10-12 ENCOUNTER — Other Ambulatory Visit: Payer: Self-pay | Admitting: Osteopathic Medicine

## 2020-10-12 DIAGNOSIS — E349 Endocrine disorder, unspecified: Secondary | ICD-10-CM

## 2020-10-12 DIAGNOSIS — IMO0001 Reserved for inherently not codable concepts without codable children: Secondary | ICD-10-CM

## 2020-10-12 DIAGNOSIS — Z789 Other specified health status: Secondary | ICD-10-CM

## 2020-12-08 ENCOUNTER — Other Ambulatory Visit: Payer: Self-pay

## 2020-12-08 DIAGNOSIS — Z789 Other specified health status: Secondary | ICD-10-CM

## 2020-12-08 DIAGNOSIS — IMO0001 Reserved for inherently not codable concepts without codable children: Secondary | ICD-10-CM

## 2020-12-08 MED ORDER — SPIRONOLACTONE 100 MG PO TABS: ORAL_TABLET | ORAL | 0 refills | Status: DC

## 2020-12-29 ENCOUNTER — Other Ambulatory Visit: Payer: Self-pay | Admitting: Osteopathic Medicine

## 2020-12-29 DIAGNOSIS — Z789 Other specified health status: Secondary | ICD-10-CM

## 2020-12-29 DIAGNOSIS — IMO0001 Reserved for inherently not codable concepts without codable children: Secondary | ICD-10-CM

## 2021-01-02 ENCOUNTER — Ambulatory Visit (INDEPENDENT_AMBULATORY_CARE_PROVIDER_SITE_OTHER): Payer: 59 | Admitting: Osteopathic Medicine

## 2021-01-02 ENCOUNTER — Encounter: Payer: Self-pay | Admitting: Osteopathic Medicine

## 2021-01-02 ENCOUNTER — Other Ambulatory Visit: Payer: Self-pay

## 2021-01-02 VITALS — BP 130/82 | HR 102 | Ht 68.0 in | Wt 219.0 lb

## 2021-01-02 DIAGNOSIS — E349 Endocrine disorder, unspecified: Secondary | ICD-10-CM

## 2021-01-02 DIAGNOSIS — Z113 Encounter for screening for infections with a predominantly sexual mode of transmission: Secondary | ICD-10-CM | POA: Diagnosis not present

## 2021-01-02 DIAGNOSIS — B009 Herpesviral infection, unspecified: Secondary | ICD-10-CM

## 2021-01-02 DIAGNOSIS — K629 Disease of anus and rectum, unspecified: Secondary | ICD-10-CM

## 2021-01-02 DIAGNOSIS — Z789 Other specified health status: Secondary | ICD-10-CM | POA: Diagnosis not present

## 2021-01-02 DIAGNOSIS — L659 Nonscarring hair loss, unspecified: Secondary | ICD-10-CM

## 2021-01-02 DIAGNOSIS — Z21 Asymptomatic human immunodeficiency virus [HIV] infection status: Secondary | ICD-10-CM

## 2021-01-02 DIAGNOSIS — IMO0001 Reserved for inherently not codable concepts without codable children: Secondary | ICD-10-CM

## 2021-01-02 DIAGNOSIS — I10 Essential (primary) hypertension: Secondary | ICD-10-CM

## 2021-01-02 DIAGNOSIS — Z1159 Encounter for screening for other viral diseases: Secondary | ICD-10-CM

## 2021-01-02 MED ORDER — FINASTERIDE 5 MG PO TABS: 5.0000 mg | ORAL_TABLET | Freq: Every day | ORAL | 3 refills | Status: DC

## 2021-01-02 MED ORDER — ESTRADIOL 2 MG PO TABS: 2.0000 mg | ORAL_TABLET | Freq: Two times a day (BID) | ORAL | 3 refills | Status: DC

## 2021-01-02 MED ORDER — SPIRONOLACTONE 100 MG PO TABS: ORAL_TABLET | ORAL | 0 refills | Status: DC

## 2021-01-02 MED ORDER — AMLODIPINE BESYLATE 5 MG PO TABS: ORAL_TABLET | ORAL | 3 refills | Status: DC

## 2021-01-02 MED ORDER — SPIRONOLACTONE 100 MG PO TABS: ORAL_TABLET | ORAL | 3 refills | Status: DC

## 2021-01-02 MED ORDER — ESTRADIOL 2 MG PO TABS: 2.0000 mg | ORAL_TABLET | Freq: Two times a day (BID) | ORAL | 0 refills | Status: DC

## 2021-01-02 MED ORDER — MINOXIDIL 5 % EX FOAM: 1.0000 mL | Freq: Two times a day (BID) | CUTANEOUS | 3 refills | Status: DC

## 2021-01-02 NOTE — Progress Notes (Addendum)
Holly Walker (legal name Holly Walker) is a 50 y.o. adult transwoman who presents to  [facility]  today, 01/02/21, seeking care for the following:  Monitro on gender affirming hormone therapy - has been stable on estrogen for years Concern for STI - recently split form partner who was cheating, concern for painful areas around rectum/anus, occasional bleeding w/ BM. NO other rashes, no discharge.      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   ADDENDUM: I SPOKE WITH THE PATIENT 01/07/21 8:29 AM RE: (+)HIV - She is of course upset but optimistic she can get to undetectable. She was informed that ID would be reaching out to start medication ASAP, and that this result has also been reported per law to the health department and they may be reaching out to her as well. Sent anxiolytic, offered work note. (not sure how I missed the (+)result or if it was in the system yesterday, there was some confusion already w/ the lab and the HSV culture, my nurse told me the HIV/HepC resutls were back and I don't think I scrolled all the way through the multiple the results to see a positive HIV or if it was even there (HIV2 was negative, I can't recall if that was the only one listed at the time), so I told the patient via MyChart in error that HIV/HepC were negative and I apologized to her for that serious oversight - there is no excuse and I should have been more thorough, I'm grateful ID reached out to let me know the confirmatory test was positive and I called the patient this morning the  moment I saw that message in my basket)   Patient Instructions    Orders Placed This Encounter  Procedures   Herpes simplex virus culture   C. trachomatis/N. gonorrhoeae RNA   CBC   COMPLETE METABOLIC PANEL WITH GFR   Lipid panel   Hepatitis C antibody   HIV Antibody (routine testing w rflx)   RPR   Testosterone   Estradiol   TIQ-NTM   HIV-1/2 AB - differentiation    Meds ordered this encounter  Medications   DISCONTD: estradiol (ESTRACE) 2 MG tablet    Sig: Take 1 tablet (2 mg total) by mouth 2 (two) times daily.    Dispense:  180 tablet    Refill:  0   finasteride (PROSCAR) 5 MG tablet    Sig: Take 1 tablet (5 mg total) by mouth daily.    Dispense:  90 tablet    Refill:  3   Minoxidil 5 % FOAM    Sig: Apply 1 mL topically 2 (two) times daily.    Dispense:  180 g    Refill:  3   DISCONTD: spironolactone (ALDACTONE) 100 MG tablet    Sig: TAKE 1 TABLET(100 MG) BY MOUTH DAILY.    Dispense:  90 tablet    Refill:  0   estradiol (ESTRACE) 2 MG tablet    Sig: Take 1 tablet (2 mg total) by mouth 2 (two) times daily.    Dispense:  180 tablet    Refill:  3  spironolactone (ALDACTONE) 100 MG tablet    Sig: TAKE 1 TABLET(100 MG) BY MOUTH DAILY.    Dispense:  90 tablet    Refill:  3   amLODipine (NORVASC) 5 MG tablet    Sig: TAKE 1 TABLET(5 MG) BY MOUTH DAILY    Dispense:  90 tablet    Refill:  3   lidocaine-prilocaine (EMLA) cream    Sig: Apply 1 application topically as needed.    Dispense:  30 g    Refill:  0     See below for relevant physical exam findings  See below for recent lab and imaging results reviewed  Medications, allergies, PMH, PSH, SocH, FamH reviewed below    Follow-up instructions: Return for RECHECK PENDING RESULTS / IF WORSE OR CHANGE.                                        Exam:  BP 130/82   Pulse (!) 102   Ht _0  (1.727 m)   Wt 219 lb (99.3 kg)   BMI 33.30 kg/m  Constitutional: VS see above.  General Appearance: alert, well-developed, well-nourished, NAD Neck: No masses, trachea midline.  Respiratory: Normal respiratory effort. no wheeze, no rhonchi, no rales Cardiovascular: S1/S2 normal, no murmur, no rub/gallop auscultated. RRR.  Musculoskeletal: Gait normal. Symmetric and independent movement of all extremities Abdominal: non-tender, non-distended, no appreciable organomegaly, neg Murphy's, BS WNLx4. Rectal exam, no masses, no blood or mucus. 2 small areas of very mild excoriation/ulceration, no blisters, Sample obtained for HSV culture.  Neurological: Normal balance/coordination. No tremor. Skin: warm, dry, intact.  Psychiatric: Normal judgment/insight. Normal mood and affect. Oriented x3.   Current Meds  Medication Sig   aspirin EC 81 MG tablet Take 1 tablet (81 mg total) by mouth daily.   lidocaine-prilocaine (EMLA) cream Apply 1 application topically as needed.   [DISCONTINUED] amLODipine (NORVASC) 5 MG tablet TAKE 1 TABLET(5 MG) BY MOUTH DAILY   [DISCONTINUED] estradiol (ESTRACE) 2 MG tablet Take 1 tablet (2 mg total) by mouth 2 (two) times daily.   [DISCONTINUED] finasteride (PROSCAR) 5 MG tablet Take 1 tablet (5 mg total) by mouth daily.   [DISCONTINUED] Minoxidil 5 % FOAM Apply 1 mL topically 2 (two) times daily.   [DISCONTINUED] spironolactone (ALDACTONE) 100 MG tablet TAKE 1 TABLET(100 MG) BY MOUTH DAILY. NO REFILLS. OVERDUE FOR A VISIT.    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   Hypertension goal BP (blood pressure) < 130/80 10/12/2017   Breast lump or mass 09/29/2017   Endocrine disorder 09/20/2017   Marijuana smoker, episodic 09/20/2017   Bipolar disorder ([place])    IBS (irritable bowel syndrome)    History of shingles 03/10/2017    Family History  Problem Relation Age of  Onset   Stroke Maternal Grandmother    Skin cancer Maternal Grandmother    Heart attack Maternal Grandfather    Valvular heart disease Maternal Grandfather    Diabetes Maternal Grandfather    Diabetes Paternal Grandmother    Stroke Paternal Grandfather    Breast cancer Paternal Aunt    Leukemia Other     Social History   Tobacco Use  Smoking Status Some Days   Packs/day:  0.50   Years: 20.00   Pack years: 10.00   Types: Cigarettes   Last attempt to quit: 09/16/2017   Years since quitting: 3.3  Smokeless Tobacco Never    Past Surgical History:  Procedure Laterality Date   CHOLECYSTECTOMY     INJECTION OF SILICONE OIL      Immunization History  Administered Date(s) Administered   Tdap 07/05/2019    Recent Results (from the past 2160 hour(s))  CBC     Status: Abnormal   Collection Time: 01/02/21 12:00 AM  Result Value Ref Range   WBC 6.5 3.8 - 10.8 Thousand/uL   RBC 5.31 4.20 - 5.80 Million/uL   Hemoglobin 17.6 (H) 13.2 - 17.1 g/dL   HCT 51.7 (H) 38.5 - 50.0 %   MCV 97.4 80.0 - 100.0 fL   MCH 33.1 (H) 27.0 - 33.0 pg   MCHC 34.0 32.0 - 36.0 g/dL   RDW 12.7 11.0 - 15.0 %   Platelets 171 140 - 400 Thousand/uL   MPV 10.5 7.5 - 12.5 fL  COMPLETE METABOLIC PANEL WITH GFR     Status: None   Collection Time: 01/02/21 12:00 AM  Result Value Ref Range   Glucose, Bld 88 65 - 99 mg/dL    Comment: .            Fasting reference interval .    BUN 14 7 - 25 mg/dL   Creat 0.74 0.60 - 1.29 mg/dL   eGFR 112 > OR = 60 mL/min/1.75m    Comment: The eGFR is based on the CKD-EPI 2021 equation. To calculate  the new eGFR from a previous Creatinine or Cystatin C result, go to https://www.kidney.org/professionals/ kdoqi/gfr%5Fcalculator    BUN/Creatinine Ratio NOT APPLICABLE 6 - 22 (calc)   Sodium 140 135 - 146 mmol/L   Potassium 4.7 3.5 - 5.3 mmol/L   Chloride 104 98 - 110 mmol/L   CO2 26 20 - 32 mmol/L   Calcium 10.2 8.6 - 10.3 mg/dL   Total Protein 7.8 6.1 - 8.1 g/dL    Albumin 4.6 3.6 - 5.1 g/dL   Globulin 3.2 1.9 - 3.7 g/dL (calc)   AG Ratio 1.4 1.0 - 2.5 (calc)   Total Bilirubin 0.5 0.2 - 1.2 mg/dL   Alkaline phosphatase (APISO) 87 36 - 130 U/L   AST 28 10 - 40 U/L   ALT 28 9 - 46 U/L  Lipid panel     Status: Abnormal   Collection Time: 01/02/21 12:00 AM  Result Value Ref Range   Cholesterol 161 <200 mg/dL   HDL 31 (L) > OR = 40 mg/dL   Triglycerides 162 (H) <150 mg/dL   LDL Cholesterol (Calc) 102 (H) mg/dL (calc)    Comment: Reference range: <100 . Desirable range <100 mg/dL for primary prevention;   <70 mg/dL for patients with CHD or diabetic patients  with > or = 2 CHD risk factors. .Marland KitchenLDL-C is now calculated using the Martin-Hopkins  calculation, which is a validated novel method providing  better accuracy than the Friedewald equation in the  estimation of LDL-C.  MCresenciano Genreet al. JAnnamaria Helling [id number];185(63: 2061-2068  (http://education.[facility].com/faq/FAQ164)    Total CHOL/HDL Ratio 5.2 (H) <5.0 (calc)   Non-HDL Cholesterol (Calc) 130 (H) <130 mg/dL (calc)    Comment: For patients with diabetes plus 1 major ASCVD risk  factor, treating to a non-HDL-C goal of <100 mg/dL  (LDL-C of <70 mg/dL) is considered a therapeutic  option.   RPR  Status: None   Collection Time: 01/02/21 12:00 AM  Result Value Ref Range   RPR Ser Ql NON-REACTIVE NON-REACTIVE  Testosterone     Status: None   Collection Time: 01/02/21 12:00 AM  Result Value Ref Range   Testosterone 637 250 - 827 ng/dL  Estradiol     Status: Abnormal   Collection Time: 01/02/21 12:00 AM  Result Value Ref Range   Estradiol 77 (H) < OR = 39 pg/mL    Comment: Reference range established on post-pubertal patient population. No pre-pubertal reference range established using this assay. For any patients for whom low Estradiol levels are anticipated (e.g. males, pre-pubertal children and hypogonadal/post-menopausal  females), the [employer] Estradiol,  Ultrasensitive, LCMSMS assay is recommended (order code [id number]). . Please note: patients being treated with the drug  fulvestrant (Faslodex(R)) have demonstrated significant  interference in immunoassay methods for estradiol  measurement. The cross reactivity could lead to falsely  elevated estradiol test results leading to an  inappropriate clinical assessment of estrogen status. [facility] order code 30289-Estradiol,  Ultrasensitive LC/MS/MS demonstrates negligible cross  reactivity with fulvestrant.   C. trachomatis/N. gonorrhoeae RNA     Status: None   Collection Time: 01/02/21 12:00 AM  Result Value Ref Range   C. trachomatis RNA, TMA NOT DETECTED NOT DETECTED   N. gonorrhoeae RNA, TMA NOT DETECTED NOT DETECTED    Comment: The analytical performance characteristics of this assay, when used to test SurePath(TM) specimens have been determined by [employer]. The modifications have not been cleared or approved by the FDA. This assay has been validated pursuant to the CLIA regulations and is used for clinical purposes. . For additional information, please refer to https://education.[facility].com/faq/FAQ154 (This link is being provided for information/ educational purposes only.) .   TIQ-NTM     Status: None   Collection Time: 01/02/21 11:02 AM  Result Value Ref Range   QUESTION/PROBLEM:      Comment: . No test(s) are indicated on the requisition for the following specimen(s). .    SPECIMEN(S) RECEIVED: Specimen AO unneeded     Comment: REQUESTED INFORMATION _________________________________ . AUTHORIZED SIGNATURE __________________________________ . TO PREVENT FURTHER DELAYS IN TESTING, PLEASE COMPLETE INFORMATION ABOVE AND EITHER FAX TO [phone] OR  EMAIL TO [email or web address] TO  RESOLVE THIS ORDER.     No results found.     All questions at time of visit were answered - patient instructed to contact office with any  additional concerns or updates. ER/RTC precautions were reviewed with the patient as applicable.   Please note: manual typing as well as voice recognition software may have been used to produce this document - typos may escape review. Please contact Dr. Sheppard Coil for any needed clarifications.

## 2021-01-05 LAB — C. TRACHOMATIS/N. GONORRHOEAE RNA
C. trachomatis RNA, TMA: NOT DETECTED
N. gonorrhoeae RNA, TMA: NOT DETECTED

## 2021-01-05 LAB — TIQ-NTM

## 2021-01-05 MED ORDER — LIDOCAINE-PRILOCAINE 2.5-2.5 % EX CREA: 1.0000 "application " | TOPICAL_CREAM | CUTANEOUS | 0 refills | Status: DC | PRN

## 2021-01-06 ENCOUNTER — Telehealth: Payer: Self-pay | Admitting: Infectious Disease

## 2021-01-06 ENCOUNTER — Telehealth: Payer: Self-pay

## 2021-01-06 LAB — LIPID PANEL
Cholesterol: 161 mg/dL (ref <200)
HDL: 31 mg/dL — ABNORMAL LOW (ref >=40)
LDL Cholesterol (Calc): 102 mg/dL (calc) — ABNORMAL HIGH
Non-HDL Cholesterol (Calc): 130 mg/dL (calc) — ABNORMAL HIGH (ref <130)
Total CHOL/HDL Ratio: 5.2 (calc) — ABNORMAL HIGH (ref <5.0)
Triglycerides: 162 mg/dL — ABNORMAL HIGH (ref <150)

## 2021-01-06 LAB — ESTRADIOL: Estradiol: 77 pg/mL — ABNORMAL HIGH (ref <=39)

## 2021-01-06 LAB — COMPLETE METABOLIC PANEL WITH GFR
AG Ratio: 1.4 (calc) (ref 1.0–2.5)
ALT: 28 U/L (ref 9–46)
AST: 28 U/L (ref 10–40)
Albumin: 4.6 g/dL (ref 3.6–5.1)
Alkaline phosphatase (APISO): 87 U/L (ref 36–130)
BUN: 14 mg/dL (ref 7–25)
CO2: 26 mmol/L (ref 20–32)
Calcium: 10.2 mg/dL (ref 8.6–10.3)
Chloride: 104 mmol/L (ref 98–110)
Creat: 0.74 mg/dL (ref 0.60–1.29)
Globulin: 3.2 g/dL (calc) (ref 1.9–3.7)
Glucose, Bld: 88 mg/dL (ref 65–99)
Potassium: 4.7 mmol/L (ref 3.5–5.3)
Sodium: 140 mmol/L (ref 135–146)
Total Bilirubin: 0.5 mg/dL (ref 0.2–1.2)
Total Protein: 7.8 g/dL (ref 6.1–8.1)
eGFR: 112 mL/min/{1.73_m2} (ref >=60)

## 2021-01-06 LAB — CBC
HCT: 51.7 % — ABNORMAL HIGH (ref 38.5–50.0)
Hemoglobin: 17.6 g/dL — ABNORMAL HIGH (ref 13.2–17.1)
MCH: 33.1 pg — ABNORMAL HIGH (ref 27.0–33.0)
MCHC: 34 g/dL (ref 32.0–36.0)
MCV: 97.4 fL (ref 80.0–100.0)
MPV: 10.5 fL (ref 7.5–12.5)
Platelets: 171 10*3/uL (ref 140–400)
RBC: 5.31 10*6/uL (ref 4.20–5.80)
RDW: 12.7 % (ref 11.0–15.0)
WBC: 6.5 10*3/uL (ref 3.8–10.8)

## 2021-01-06 LAB — HIV ANTIBODY (ROUTINE TESTING W REFLEX): HIV 1&2 Ab, 4th Generation: REACTIVE — AB

## 2021-01-06 LAB — RPR: RPR Ser Ql: NONREACTIVE

## 2021-01-06 LAB — HEPATITIS C ANTIBODY
Hepatitis C Ab: NONREACTIVE
SIGNAL TO CUT-OFF: 0.08 (ref <1.00)

## 2021-01-06 LAB — HIV-1/2 AB - DIFFERENTIATION
HIV-1 antibody: POSITIVE — AB
HIV-2 Ab: NEGATIVE

## 2021-01-06 LAB — TESTOSTERONE: Testosterone: 637 ng/dL (ref 250–827)

## 2021-01-06 NOTE — Telephone Encounter (Signed)
-----   Message from Veryl Speak, FNP sent at 01/06/2021  2:42 PM EDT ----- Regarding: New HIV Ms. 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 I am not sure that she is aware of her diagnosis yet as it has not been read on MyChart. Tested through [facility].   Thanks!

## 2021-01-06 NOTE — Telephone Encounter (Signed)
Patient's HIV confirm test is +  We have sent referral to [facility] however if you feel comfortable disussing w pt since you ordered test and have met w patient we would be happy to expedite rapid start of ARV in our clinic

## 2021-01-06 NOTE — Telephone Encounter (Signed)
RN faxed referral to [facility].   Sandie Ano, RN

## 2021-01-07 DIAGNOSIS — Z21 Asymptomatic human immunodeficiency virus [HIV] infection status: Secondary | ICD-10-CM | POA: Insufficient documentation

## 2021-01-07 MED ORDER — CLONAZEPAM 1 MG PO TABS: 0.5000 mg | ORAL_TABLET | Freq: Three times a day (TID) | ORAL | 1 refills | Status: DC | PRN

## 2021-01-07 NOTE — Addendum Note (Signed)
Addended by: Deirdre Pippins on: 01/07/2021 08:37 AM   Modules accepted: Orders

## 2021-01-07 NOTE — Telephone Encounter (Signed)
Yes, I've spoken to the patient, Holly Walker, and she ok to come in ASAP

## 2021-01-09 ENCOUNTER — Ambulatory Visit: Payer: 59 | Admitting: Pharmacist

## 2021-01-09 ENCOUNTER — Telehealth: Payer: Self-pay

## 2021-01-09 ENCOUNTER — Other Ambulatory Visit: Payer: Self-pay

## 2021-01-09 ENCOUNTER — Encounter: Payer: Self-pay | Admitting: Internal Medicine

## 2021-01-09 ENCOUNTER — Other Ambulatory Visit (HOSPITAL_COMMUNITY): Payer: Self-pay

## 2021-01-09 ENCOUNTER — Ambulatory Visit (INDEPENDENT_AMBULATORY_CARE_PROVIDER_SITE_OTHER): Payer: 59 | Admitting: Internal Medicine

## 2021-01-09 ENCOUNTER — Ambulatory Visit: Payer: 59

## 2021-01-09 VITALS — BP 136/82 | HR 97 | Resp 16 | Ht 68.0 in | Wt 219.0 lb

## 2021-01-09 DIAGNOSIS — Z23 Encounter for immunization: Secondary | ICD-10-CM | POA: Diagnosis not present

## 2021-01-09 DIAGNOSIS — Z79899 Other long term (current) drug therapy: Secondary | ICD-10-CM | POA: Diagnosis not present

## 2021-01-09 DIAGNOSIS — Z21 Asymptomatic human immunodeficiency virus [HIV] infection status: Secondary | ICD-10-CM

## 2021-01-09 MED ORDER — BICTEGRAVIR-EMTRICITAB-TENOFOV 50-200-25 MG PO TABS: 1.0000 | ORAL_TABLET | Freq: Every day | ORAL | 11 refills | Status: DC

## 2021-01-09 NOTE — Progress Notes (Signed)
    [facility]  Name:  Holly Walker    MRN: [id number] DOB: 10-16-1970   01/09/2021  [name] was observed post JYNNEOS immunization for 15 minutes without incident. She was provided with Vaccine Information Sheet and instruction to access the V-Safe system.   [name] was instructed to call 911 with any severe reactions post vaccine: Difficulty breathing  Swelling of face and throat  A fast heartbeat  A bad rash all over body  Dizziness and weakness     Sandie Ano, RN

## 2021-01-09 NOTE — Addendum Note (Signed)
Addended by: Clayborne Artist A on: 01/09/2021 04:42 PM   Modules accepted: Orders

## 2021-01-09 NOTE — Telephone Encounter (Signed)
RCID Patient Product/process development scientist completed.    The patient is insured through [facility]. Insurance only pays for [employer] [phone] All other medication in category is not on formulary.  We will continue to follow to see if copay assistance is needed.  Clearance Coots, CPhT Specialty Pharmacy Patient [facility] Phone: [phone] Fax:  [phone]

## 2021-01-09 NOTE — Progress Notes (Signed)
Patient ID: Holly Walker, adult    DOB: 07-30-1970, 50 y.o.   MRN: [id number]  Reason for visit: to establish care as a new patient with HIV  HPI:   Patient was first diagnosed in the last week during routine care by her PCP.  She is followed for transgender care and is on hormone therapy with Dr. Lyn Hollingshead.  She was tested as part routine screening.  Had found out her previous partner had other sexual partners.    Previously tested negative about 4 years ago, no recent testing.  No labs prior to this appointment.  She is followed by Dr. Lyn Hollingshead for primary care and is on hormone therapy with her.  She is still absorbing the new diagnosis and difficulty adjusting to ending the relationship with her long term partner.  She has heard about Biktarvy.  She is not interested in [place].  Takes vitamins daily and ok with taking medication.    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 Authorizing Provider  amLODipine (NORVASC) 5 MG tablet TAKE 1 TABLET(5 MG) BY MOUTH DAILY 01/02/21   Sunnie Nielsen, DO  aspirin EC 81 MG tablet Take 1 tablet (81 mg total) by mouth daily. 01/05/19   Carlis Stable, PA-C  clonazePAM (KLONOPIN) 1 MG tablet Take 0.5-1 tablets (0.5-1 mg total) by mouth 3 (three) times daily as needed for anxiety. 01/07/21   Sunnie Nielsen, DO  estradiol (ESTRACE) 2 MG tablet Take 1 tablet (2 mg total) by mouth 2 (two) times daily. 01/02/21 04/02/21  Sunnie Nielsen, DO  finasteride (PROSCAR) 5 MG tablet Take 1 tablet (5 mg total) by mouth daily. 01/02/21   Sunnie Nielsen, DO  lidocaine-prilocaine (EMLA) cream Apply 1 application topically as needed. 01/05/21   Sunnie Nielsen, DO   Minoxidil 5 % FOAM Apply 1 mL topically 2 (two) times daily. 01/02/21   Sunnie Nielsen, DO  spironolactone (ALDACTONE) 100 MG tablet TAKE 1 TABLET(100 MG) BY MOUTH DAILY. 01/02/21   Sunnie Nielsen, DO    No Known Allergies  Social History   Tobacco Use   Smoking status: Some Days    Packs/day: 0.50    Years: 20.00    Pack years: 10.00    Types: Cigarettes    Last attempt to quit: 09/16/2017    Years since quitting: 3.3   Smokeless tobacco: Never  Vaping Use   Vaping Use: Former  Substance Use Topics   Alcohol use: Not Currently    Alcohol/week: 1.0 - 2.0 standard drink    Types: 1 - 2 Standard drinks or equivalent per week   Drug use: Yes    Types: Marijuana    Family History  Problem Relation Age of Onset   Stroke Maternal Grandmother    Skin cancer Maternal Grandmother    Heart attack Maternal Grandfather    Valvular heart disease Maternal Grandfather    Diabetes Maternal Grandfather    Diabetes Paternal Grandmother    Stroke Paternal Grandfather    Breast cancer Paternal Aunt    Leukemia Other      Review of Systems Constitutional: negative for fatigue and malaise Gastrointestinal: negative for nausea and diarrhea Integument/breast:  negative for rash Musculoskeletal: negative for myalgias and arthralgias All other systems reviewed and are negative    CONSTITUTIONAL:in no apparent distress There were no vitals filed for this visit. EYES: anicteric HENT: no thrush CARD:Cor RRR RESP:clear WU:JWJXB sounds are normal, liver is not enlarged, spleen is not enlarged MS:no pedal edema noted SKIN:no rash NEURO: non-focal  No results found for: HIV1RNAQUANT No components found for: HIV1GENOTYPRPLUS No components found for: THELPERCELL  Assessment: new patient here with HIV.  Discussed with patient treatment options and side effects, benefits of treatment, long term outcomes.  I discussed the severity of untreated HIV including higher cancer risk,  opportunistic infections, renal failure.  Also discussed needing to use condoms, partner disclosure, necessary vaccines, blood monitoring.  All questions answered.    Plan: 1)  rapid start with Biktarvy, left with it in hand.   2) labs today  3) Monkeypox vaccine offered and given today, will do the next in 4 weeks at follow up.  Will need to hold off for the COVID-19 booster for 4 weeks after the second monkeypox vaccine 4) will schedule for counseling 5) PCv -13  Follow up in 4 weeks and repeat CD4 and viral load, monkeypox #2

## 2021-01-10 ENCOUNTER — Encounter: Payer: Self-pay | Admitting: Osteopathic Medicine

## 2021-01-10 LAB — URINALYSIS, ROUTINE W REFLEX MICROSCOPIC
Bilirubin Urine: NEGATIVE
Glucose, UA: NEGATIVE
Hgb urine dipstick: NEGATIVE
Ketones, ur: NEGATIVE
Leukocytes,Ua: NEGATIVE
Nitrite: NEGATIVE
Protein, ur: NEGATIVE
Specific Gravity, Urine: 1.022 (ref 1.001–1.035)
pH: 6.5 (ref 5.0–8.0)

## 2021-01-10 MED ORDER — ESTRADIOL 2 MG PO TABS: ORAL_TABLET | ORAL | 0 refills | Status: DC

## 2021-01-10 NOTE — Addendum Note (Signed)
Addended by: Deirdre Pippins on: 01/10/2021 09:40 AM   Modules accepted: Orders

## 2021-01-13 ENCOUNTER — Telehealth: Payer: Self-pay

## 2021-01-13 ENCOUNTER — Other Ambulatory Visit: Payer: Self-pay | Admitting: Internal Medicine

## 2021-01-13 DIAGNOSIS — B009 Herpesviral infection, unspecified: Secondary | ICD-10-CM | POA: Insufficient documentation

## 2021-01-13 LAB — SURESWAB HSV, TYPE 1/2 DNA, PCR
HSV 1 DNA: NOT DETECTED
HSV 2 DNA: DETECTED — AB

## 2021-01-13 LAB — TEST AUTHORIZATION

## 2021-01-13 LAB — HERPES SIMPLEX VIRUS CULTURE

## 2021-01-13 MED ORDER — SULFAMETHOXAZOLE-TRIMETHOPRIM 800-160 MG PO TABS: 1.0000 | ORAL_TABLET | Freq: Every day | ORAL | 2 refills | Status: DC

## 2021-01-13 MED ORDER — VALACYCLOVIR HCL 500 MG PO TABS: 500.0000 mg | ORAL_TABLET | Freq: Two times a day (BID) | ORAL | 0 refills | Status: DC

## 2021-01-13 NOTE — Telephone Encounter (Signed)
Spoke with patient, relayed that Dr. Luciana Axe has sent in a medication called Bactrim to protect her from certain infections since her CD4 count is low at 83.  Discussed normal CD4 count and that the hope is her immune system will grow stronger as she continues to take [employer]. Asked that she please call with any questions. Patient verbalized understanding and has no further questions.   Sandie Ano, RN

## 2021-01-13 NOTE — Telephone Encounter (Signed)
-----   Message from Gardiner Barefoot, MD sent at 01/13/2021 10:56 AM EDT ----- Because her immune system is low, I would like her to start Bactrim once daily.  I have sent it in to the pharmacy.   Thanks

## 2021-01-13 NOTE — Addendum Note (Signed)
Addended by: Deirdre Pippins on: 01/13/2021 03:56 PM   Modules accepted: Orders

## 2021-01-20 ENCOUNTER — Other Ambulatory Visit: Payer: Self-pay | Admitting: Pharmacist

## 2021-01-20 DIAGNOSIS — Z21 Asymptomatic human immunodeficiency virus [HIV] infection status: Secondary | ICD-10-CM

## 2021-01-20 LAB — HEPATITIS B SURFACE ANTIGEN: Hepatitis B Surface Ag: NONREACTIVE

## 2021-01-20 LAB — HIV-1 RNA ULTRAQUANT REFLEX TO GENTYP+
HIV 1 RNA Quant: 379000 copies/mL — ABNORMAL HIGH
HIV-1 RNA Quant, Log: 5.58 Log copies/mL — ABNORMAL HIGH

## 2021-01-20 LAB — QUANTIFERON-TB GOLD PLUS
Mitogen-NIL: >10 IU/mL
NIL: 0.04 IU/mL
QuantiFERON-TB Gold Plus: NEGATIVE
TB1-NIL: 0 IU/mL
TB2-NIL: 0 IU/mL

## 2021-01-20 LAB — HIV-1 GENOTYPE: HIV-1 Genotype: DETECTED — AB

## 2021-01-20 LAB — T-HELPER CELLS (CD4) COUNT (NOT AT ARMC)
Absolute CD4: 83 cells/uL — ABNORMAL LOW (ref 490–1740)
CD4 T Helper %: 6 % — ABNORMAL LOW (ref 30–61)
Total lymphocyte count: 1477 cells/uL (ref 850–3900)

## 2021-01-20 LAB — HEPATITIS B CORE ANTIBODY, TOTAL: Hep B Core Total Ab: NONREACTIVE

## 2021-01-20 LAB — HEPATITIS A ANTIBODY, TOTAL: Hepatitis A AB,Total: NONREACTIVE

## 2021-01-20 LAB — HEPATITIS B SURFACE ANTIBODY,QUALITATIVE: Hep B S Ab: NONREACTIVE

## 2021-01-20 MED ORDER — BICTEGRAVIR-EMTRICITAB-TENOFOV 50-200-25 MG PO TABS: 1.0000 | ORAL_TABLET | Freq: Every day | ORAL | 0 refills | Status: AC

## 2021-01-20 NOTE — Progress Notes (Signed)
Medication Samples have been provided to the patient.  Drug name: Biktarvy        Strength: 50/200/25 mg       Qty: 28 tablets (4 bottles) LOT: [id number]   Exp.Date: 6/24  Dosing instructions: Take one tablet by mouth once daily  The patient has been instructed regarding the correct time, dose, and frequency of taking this medication, including desired effects and most common side effects.   Sheila Ocasio, PharmD, CPP Clinical Pharmacist Practitioner Infectious Diseases Clinical Pharmacist [facility]  

## 2021-01-23 ENCOUNTER — Encounter: Payer: Self-pay | Admitting: Osteopathic Medicine

## 2021-01-23 MED ORDER — ESTRADIOL 2 MG PO TABS: 4.0000 mg | ORAL_TABLET | Freq: Two times a day (BID) | ORAL | 0 refills | Status: DC

## 2021-01-23 NOTE — Addendum Note (Signed)
Addended by: Deirdre Pippins on: 01/23/2021 12:56 PM   Modules accepted: Orders

## 2021-01-29 ENCOUNTER — Ambulatory Visit (INDEPENDENT_AMBULATORY_CARE_PROVIDER_SITE_OTHER): Payer: 59 | Admitting: Internal Medicine

## 2021-01-29 ENCOUNTER — Encounter: Payer: Self-pay | Admitting: Internal Medicine

## 2021-01-29 ENCOUNTER — Other Ambulatory Visit: Payer: Self-pay

## 2021-01-29 ENCOUNTER — Other Ambulatory Visit (HOSPITAL_COMMUNITY): Payer: Self-pay

## 2021-01-29 VITALS — BP 134/84 | HR 98 | Resp 16 | Ht 68.0 in | Wt 225.0 lb

## 2021-01-29 DIAGNOSIS — Z21 Asymptomatic human immunodeficiency virus [HIV] infection status: Secondary | ICD-10-CM | POA: Diagnosis not present

## 2021-01-29 DIAGNOSIS — Z9189 Other specified personal risk factors, not elsewhere classified: Secondary | ICD-10-CM | POA: Diagnosis not present

## 2021-01-29 DIAGNOSIS — Z23 Encounter for immunization: Secondary | ICD-10-CM | POA: Insufficient documentation

## 2021-01-29 NOTE — Assessment & Plan Note (Signed)
CD4 is 32 and she is at risk for pneumocystis Bactrim DS one daily started

## 2021-01-29 NOTE — Assessment & Plan Note (Addendum)
She is doing well on Biktarvy and will confirm with pharmacy that she has coverage.   Will check the viral load today to verify she is getting suppressed. Ok from ID standpoint for oral or IM hormone therapy.  Follow up in 2 months and will recheck.

## 2021-01-29 NOTE — Progress Notes (Signed)
   Subjective:    Patient ID: Holly Walker, adult    DOB: 1970/07/19, 50 y.o.   MRN: [id number]  HPI Here for follow up for HIV She was diagnosed last month by her PCP and had her first appointment with me and started on Biktarvy.  Labs at that time with a CD4 count of 83 and viral load 379,000.  She is hepatitis A and B non-immune.  She has had some sleep issues she has noted on Biktarvy and started on clonazepam by her PCP to help with sleep.  She is her with a support person.  She is asking about appropriate hormone therapy routes.     Review of Systems  Constitutional:  Negative for fatigue.  Gastrointestinal:  Negative for diarrhea and nausea.  Skin:  Negative for rash.      Objective:   Physical Exam Eyes:     General: No scleral icterus. Pulmonary:     Effort: Pulmonary effort is normal.  Skin:    Findings: No rash.  Neurological:     General: No focal deficit present.     Mental Status: She is alert.  Psychiatric:        Mood and Affect: Mood normal.    SH: + tobacco      Assessment & Plan:

## 2021-01-29 NOTE — Assessment & Plan Note (Signed)
Discussed hepatitis A and B non-immune status and vaccine series started today She will return in 1 week for monkeypox #2 vaccine

## 2021-01-30 LAB — T-HELPER CELL (CD4) - (RCID CLINIC ONLY)
CD4 % Helper T Cell: 7 % — ABNORMAL LOW (ref 33–65)
CD4 T Cell Abs: 107 /uL — ABNORMAL LOW (ref 400–1790)

## 2021-02-01 LAB — COMPLETE METABOLIC PANEL WITH GFR
AG Ratio: 1.7 (calc) (ref 1.0–2.5)
ALT: 23 U/L (ref 9–46)
AST: 21 U/L (ref 10–40)
Albumin: 4.2 g/dL (ref 3.6–5.1)
Alkaline phosphatase (APISO): 64 U/L (ref 36–130)
BUN: 12 mg/dL (ref 7–25)
CO2: 27 mmol/L (ref 20–32)
Calcium: 9.1 mg/dL (ref 8.6–10.3)
Chloride: 104 mmol/L (ref 98–110)
Creat: 0.86 mg/dL (ref 0.60–1.29)
Globulin: 2.5 g/dL (calc) (ref 1.9–3.7)
Glucose, Bld: 80 mg/dL (ref 65–99)
Potassium: 4.2 mmol/L (ref 3.5–5.3)
Sodium: 137 mmol/L (ref 135–146)
Total Bilirubin: 0.6 mg/dL (ref 0.2–1.2)
Total Protein: 6.7 g/dL (ref 6.1–8.1)
eGFR: 107 mL/min/{1.73_m2} (ref >=60)

## 2021-02-01 LAB — HIV-1 RNA QUANT-NO REFLEX-BLD
HIV 1 RNA Quant: 86 Copies/mL — ABNORMAL HIGH
HIV-1 RNA Quant, Log: 1.94 Log cps/mL — ABNORMAL HIGH

## 2021-02-04 ENCOUNTER — Other Ambulatory Visit: Payer: Self-pay | Admitting: Pharmacist

## 2021-02-04 DIAGNOSIS — Z21 Asymptomatic human immunodeficiency virus [HIV] infection status: Secondary | ICD-10-CM

## 2021-02-04 MED ORDER — BICTEGRAVIR-EMTRICITAB-TENOFOV 50-200-25 MG PO TABS: 1.0000 | ORAL_TABLET | Freq: Every day | ORAL | 0 refills | Status: AC

## 2021-02-04 NOTE — Progress Notes (Signed)
Medication Samples have been provided to the patient.  Drug name: Biktarvy        Strength: 50/200/25 mg       Qty: 7 tablets (1 bottles) LOT: [id number]   Exp.Date: 10/24  Dosing instructions: Take one tablet by mouth once daily  The patient has been instructed regarding the correct time, dose, and frequency of taking this medication, including desired effects and most common side effects.   Margit Batte, PharmD, CPP Clinical Pharmacist Practitioner Infectious Diseases Clinical Pharmacist [facility]  

## 2021-02-06 ENCOUNTER — Ambulatory Visit: Payer: 59

## 2021-02-12 ENCOUNTER — Other Ambulatory Visit: Payer: Self-pay

## 2021-02-12 ENCOUNTER — Ambulatory Visit (INDEPENDENT_AMBULATORY_CARE_PROVIDER_SITE_OTHER): Payer: 59

## 2021-02-12 DIAGNOSIS — Z23 Encounter for immunization: Secondary | ICD-10-CM | POA: Diagnosis not present

## 2021-02-12 NOTE — Progress Notes (Signed)
    [facility]  Name:  CAMBRIA OSTEN    MRN: [id number] DOB: 1971-04-14   02/12/2021  Ms. Gores was observed post JYNNEOS immunization for 15 minutes without incident. She was provided with Vaccine Information Sheet and instruction to access the V-Safe system.   Ms. Parrack was instructed to call 911 with any severe reactions post vaccine: Difficulty breathing  Swelling of face and throat  A fast heartbeat  A bad rash all over body  Dizziness and weakness     Tykia Mellone T Pricilla Loveless

## 2021-02-27 ENCOUNTER — Other Ambulatory Visit: Payer: Self-pay

## 2021-02-27 ENCOUNTER — Ambulatory Visit: Payer: 59

## 2021-02-27 DIAGNOSIS — Z789 Other specified health status: Secondary | ICD-10-CM

## 2021-02-27 MED ORDER — ESTRADIOL 2 MG PO TABS
4.0000 mg | ORAL_TABLET | Freq: Two times a day (BID) | ORAL | 0 refills | Status: DC
Start: 2021-02-27 — End: 2021-05-29

## 2021-03-03 ENCOUNTER — Other Ambulatory Visit: Payer: Self-pay

## 2021-03-03 DIAGNOSIS — L659 Nonscarring hair loss, unspecified: Secondary | ICD-10-CM

## 2021-03-03 MED ORDER — FINASTERIDE 5 MG PO TABS: 5.0000 mg | ORAL_TABLET | Freq: Every day | ORAL | 3 refills | Status: DC

## 2021-03-04 ENCOUNTER — Telehealth: Payer: Self-pay

## 2021-03-04 NOTE — Telephone Encounter (Signed)
[facility] pharmacy requesting med refill for valacyclovir 500 mg. Rx not listed in active med list.

## 2021-03-05 ENCOUNTER — Other Ambulatory Visit: Payer: Self-pay | Admitting: Medical-Surgical

## 2021-03-05 MED ORDER — VALACYCLOVIR HCL 500 MG PO TABS: 500.0000 mg | ORAL_TABLET | Freq: Two times a day (BID) | ORAL | 0 refills | Status: AC

## 2021-03-05 NOTE — Telephone Encounter (Signed)
Valtrex prescription sent.  Thayer Ohm, DNP, APRN, FNP-BC  [facility]

## 2021-03-05 NOTE — Telephone Encounter (Signed)
Contacted patient regarding refill request from the pharmacy. Patient is indeed asking for a refill on valacyclovir for a current outbreak. Per patient, Dr. Lyn Hollingshead gave her the rx in the past. Patient was transferred to scheduler's desk to schedule an appt to transition care.

## 2021-03-06 ENCOUNTER — Encounter: Payer: Self-pay | Admitting: Family Medicine

## 2021-03-06 ENCOUNTER — Ambulatory Visit (INDEPENDENT_AMBULATORY_CARE_PROVIDER_SITE_OTHER): Payer: 59 | Admitting: Family Medicine

## 2021-03-06 VITALS — BP 114/60 | HR 98 | Ht 68.0 in | Wt 222.0 lb

## 2021-03-06 DIAGNOSIS — L723 Sebaceous cyst: Secondary | ICD-10-CM

## 2021-03-06 MED ORDER — DOXYCYCLINE HYCLATE 100 MG PO TABS
100.0000 mg | ORAL_TABLET | Freq: Two times a day (BID) | ORAL | 0 refills | Status: AC
Start: 2021-03-06 — End: 2021-03-13

## 2021-03-06 NOTE — Patient Instructions (Signed)
Cyst: Warm compresses - careful not to burn your skin Do not pick at the area, keep it clean Antibiotic Follow-up in 2-3 weeks for reevaluation and cyst removal if indicated

## 2021-03-06 NOTE — Progress Notes (Signed)
Acute Office Visit  Subjective:    Patient ID: Holly Walker, adult    DOB: December 24, 1970, 50 y.o.   MRN: [id number]  Chief Complaint  Patient presents with   Cyst    HPI Patient is in today for right lower back cyst.   About 3 weeks ago, patient noticed some mild discomfort to her right lower back and then noticed a small bump the size of a pimple, but without a head. States it has progressively gotten larger and more tender. She denies any surrounding erythema, rashes, fevers, drainage. States the area is sensitive and pain can get up to 6/10 with palpation.   She has been on daily Bactrim for the past 2 months for HIV management.     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   History of shingles 03/2017   right flank   IBS (irritable bowel syndrome)    Low serum thyroid stimulating hormone (TSH) 09/21/2017   Tobacco use     Past Surgical History:  Procedure Laterality Date   CHOLECYSTECTOMY     INJECTION OF SILICONE OIL      Family History  Problem Relation Age of Onset   Stroke Maternal Grandmother    Skin cancer Maternal Grandmother    Heart attack Maternal Grandfather    Valvular heart disease Maternal Grandfather    Diabetes Maternal Grandfather    Diabetes Paternal Grandmother    Stroke Paternal Grandfather    Breast cancer Paternal Aunt    Leukemia Other     Social History   Socioeconomic History   Marital status: Soil scientist    Spouse name: Dominik   Number of children: Not on file   Years of education: Not on file   Highest education level: Not on file  Occupational History   Not on file  Tobacco Use   Smoking status: Some Days    Packs/day: 0.50    Years: 20.00    Pack years: 10.00    Types: Cigarettes    Last attempt to quit: 09/16/2017    Years since quitting: 3.4   Smokeless tobacco: Never  Vaping Use   Vaping Use: Former  Substance and Sexual Activity   Alcohol  use: Not Currently    Alcohol/week: 1.0 - 2.0 standard drink    Types: 1 - 2 Standard drinks or equivalent per week   Drug use: Yes    Types: Marijuana   Sexual activity: Yes    Birth control/protection: None  Other Topics Concern   Not on file  Social History Narrative   Not on file   Social Determinants of Health   Financial Resource Strain: Not on file  Food Insecurity: Not on file  Transportation Needs: Not on file  Physical Activity: Not on file  Stress: Not on file  Social Connections: Not on file  Intimate Partner Violence: Not on file    Outpatient Medications Prior to Visit  Medication Sig Dispense Refill   amLODipine (NORVASC) 5 MG tablet TAKE 1 TABLET(5 MG) BY MOUTH DAILY 90 tablet 3   aspirin EC 81 MG tablet Take 1 tablet (81 mg total) by mouth daily. 90 tablet 3   bictegravir-emtricitabine-tenofovir AF (BIKTARVY) 50-200-25 MG TABS tablet Take 1 tablet by mouth daily. 30 tablet 11   clonazePAM (KLONOPIN) 1 MG tablet Take 0.5-1 tablets (0.5-1 mg total) by mouth 3 (three) times daily as needed for anxiety. 15 tablet 1  Dentifrices (BIOTENE DRY MOUTH DT) Place onto teeth.     estradiol (ESTRACE) 2 MG tablet Take 2 tablets (4 mg total) by mouth in the morning and at bedtime. 4 mg (2 tablets) po AM, 2 mg (1 tablet) po PM 360 tablet 0   finasteride (PROSCAR) 5 MG tablet Take 1 tablet (5 mg total) by mouth daily. 90 tablet 3   lidocaine-prilocaine (EMLA) cream Apply 1 application topically as needed. 30 g 0   Minoxidil 5 % FOAM Apply 1 mL topically 2 (two) times daily. 180 g 3   Multiple Vitamin (MULTIVITAMIN) tablet Take 1 tablet by mouth daily.     Pyridoxine HCl (VITAMIN B6) 100 MG TABS Take by mouth.     spironolactone (ALDACTONE) 100 MG tablet TAKE 1 TABLET(100 MG) BY MOUTH DAILY. 90 tablet 3   sulfamethoxazole-trimethoprim (BACTRIM DS) 800-160 MG tablet Take 1 tablet by mouth daily. 30 tablet 2   Thiamine HCl (VITAMIN B-1) 250 MG tablet Take 250 mg by mouth daily.      valACYclovir (VALTREX) 500 MG tablet Take 1 tablet (500 mg total) by mouth 2 (two) times daily for 3 days. As needed for outbreak 18 tablet 0   No facility-administered medications prior to visit.    No Known Allergies  Review of Systems All review of systems negative except what is listed in the HPI     Objective:    Physical Exam Vitals reviewed.  Constitutional:      Appearance: Normal appearance.  Skin:    General: Skin is warm and dry.     Comments: Approximately 1 cm raised area with erythema, tenderness, no drainage or surrounding erythema, inflammation or streaking. Presentation consistent with sebaceous cyst   Neurological:     Mental Status: She is alert and oriented to person, place, and time.  Psychiatric:        Mood and Affect: Mood normal.        Behavior: Behavior normal.        Thought Content: Thought content normal.        Judgment: Judgment normal.    BP 114/60   Pulse 98   Ht 5' 8"  (1.727 m)   Wt 222 lb (100.7 kg)   SpO2 100%   BMI 33.75 kg/m  Wt Readings from Last 3 Encounters:  03/06/21 222 lb (100.7 kg)  01/29/21 225 lb (102.1 kg)  01/09/21 219 lb (99.3 kg)    Health Maintenance Due  Topic Date Due   COVID-19 Vaccine (1) Never done   COLONOSCOPY (Pts 45-76yr Insurance coverage will need to be confirmed)  Never done    There are no preventive care reminders to display for this patient.   Lab Results  Component Value Date   TSH 0.55 07/13/2019   Lab Results  Component Value Date   WBC 6.5 01/02/2021   HGB 17.6 (H) 01/02/2021   HCT 51.7 (H) 01/02/2021   MCV 97.4 01/02/2021   PLT 171 01/02/2021   Lab Results  Component Value Date   NA 137 01/29/2021   K 4.2 01/29/2021   CO2 27 01/29/2021   GLUCOSE 80 01/29/2021   BUN 12 01/29/2021   CREATININE 0.86 01/29/2021   BILITOT 0.6 01/29/2021   AST 21 01/29/2021   ALT 23 01/29/2021   PROT 6.7 01/29/2021   CALCIUM 9.1 01/29/2021   EGFR 107 01/29/2021   Lab Results  Component  Value Date   CHOL 161 01/02/2021   Lab Results  Component Value Date  HDL 31 (L) 01/02/2021   Lab Results  Component Value Date   LDLCALC 102 (H) 01/02/2021   Lab Results  Component Value Date   TRIG 162 (H) 01/02/2021   Lab Results  Component Value Date   CHOLHDL 5.2 (H) 01/02/2021   Lab Results  Component Value Date   HGBA1C 5.2 09/16/2017       Assessment & Plan:    1. Sebaceous cyst Likely sebaceous cyst. Given current state, would like to treat with doxycycline as her daily bactrim has not resolved the infection. Encouraged warm compresses and keeping the area clean. Recommend follow-up in 2-3 weeks after acute phase has improved - can consider cyst removal if necessary. Patient aware of signs/symptoms requiring further/urgent evaluation. Patient agreeable to plan.   - doxycycline (VIBRA-TABS) 100 MG tablet; Take 1 tablet (100 mg total) by mouth 2 (two) times daily for 7 days.  Dispense: 14 tablet; Refill: 0  Discussed with Dr. Darene Lamer.   Please contact office for sooner follow-up if symptoms do not improve or worsen. Seek emergency care if symptoms become severe.  Purcell Nails Olevia Bowens, DNP, FNP-C

## 2021-03-18 ENCOUNTER — Ambulatory Visit (INDEPENDENT_AMBULATORY_CARE_PROVIDER_SITE_OTHER): Payer: 59 | Admitting: Sports Medicine

## 2021-03-18 ENCOUNTER — Other Ambulatory Visit: Payer: Self-pay

## 2021-03-18 DIAGNOSIS — L02212 Cutaneous abscess of back [any part, except buttock]: Secondary | ICD-10-CM

## 2021-03-18 MED ORDER — HYDROCODONE-ACETAMINOPHEN 10-325 MG PO TABS: 1.0000 | ORAL_TABLET | Freq: Three times a day (TID) | ORAL | 0 refills | Status: DC | PRN

## 2021-03-18 MED ORDER — RIFAMPIN 300 MG PO CAPS: 300.0000 mg | ORAL_CAPSULE | Freq: Every day | ORAL | 0 refills | Status: DC

## 2021-03-18 NOTE — Progress Notes (Addendum)
    Procedures performed today:    Procedure: Complicated Incision and drainage of abscess. Risks, benefits, and alternatives explained and consent obtained. Time out conducted. Surface cleaned with alcohol. 10cc lidocaine with epinephine infiltrated around abscess. Adequate anesthesia ensured. Area prepped and draped in a sterile fashion. #11 blade used to make a stab incision into abscess. Pus expressed with pressure. Curved hemostat used to explore 4 quadrants and loculations broken up. Further purulence expressed. Iodoform packing placed leaving a 1-inch tail. Hemostasis achieved. Pt stable. Aftercare and follow-up advised.  Independent interpretation of notes and tests performed by another provider:   None.  Brief History, Exam, Impression, and Recommendations:    Abscess of lower back This is a pleasant 50 year old female, she has had a boil on her back for some time now, initially suspected to be a sebaceous cyst, started on doxycycline, unfortunately it has not improved. 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 As she did not improve with doxycycline we did an incision and drainage today, we did encounter some pus, but no evidence of an epidermoid cyst or capsule. As these are typically MRSA type infections and rifampin has the best coverage of the orals we will use this. Adding high-dose hydrocodone for postoperative pain. I did pack the incision so she will need to remove approximately 3 inches of packing daily leaving a 1 inch tail until all the packing is removed, return to see me in about 2 weeks.  Rifampin contraindicated with Biktarvy, switching to clindamycin.    ___________________________________________ Ihor Austin. Benjamin Stain, M.D., ABFM., CAQSM. Primary Care and Sports Medicine [facility]  Adjunct Instructor of Family Medicine  [employer]

## 2021-03-18 NOTE — Assessment & Plan Note (Addendum)
This is a pleasant 50 year old female, she has had a boil on her back for some time now, initially suspected to be a sebaceous cyst, started on doxycycline, unfortunately it has not improved. 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 As she did not improve with doxycycline we did an incision and drainage today, we did encounter some pus, but no evidence of an epidermoid cyst or capsule. As these are typically MRSA type infections and rifampin has the best coverage of the orals we will use this. Adding high-dose hydrocodone for postoperative pain. I did pack the incision so she will need to remove approximately 3 inches of packing daily leaving a 1 inch tail until all the packing is removed, return to see me in about 2 weeks.  Rifampin contraindicated with Biktarvy, switching to clindamycin.

## 2021-03-18 NOTE — Patient Instructions (Signed)
Incision and Drainage, Care After This sheet gives you information about how to care for yourself after your procedure. Your health care provider may also give you more specific instructions. If you have problems or questions, contact your health careprovider. What can I expect after the procedure? After the procedure, it is common to have: Pain or discomfort around the incision site. Blood, fluid, or pus (drainage) from the incision. Redness and firm skin around the incision site. Follow these instructions at home: Medicines Take over-the-counter and prescription medicines only as told by your health care provider. If you were prescribed an antibiotic medicine, use or take it as told by your health care provider. Do not stop using the antibiotic even if you start to feel better. Wound care Follow instructions from your health care provider about how to take care of your wound. Make sure you: Wash your hands with soap and water before and after you change your bandage (dressing). If soap and water are not available, use hand sanitizer. Change your dressing and packing as told by your health care provider. If your dressing is dry or stuck when you try to remove it, moisten or wet the dressing with saline or water so that it can be removed without harming your skin or tissues. If your wound is packed, leave it in place until your health care provider tells you to remove it. To remove the packing, moisten or wet the packing with saline or water so that it can be removed without harming your skin or tissues. Leave stitches (sutures), skin glue, or adhesive strips in place. These skin closures may need to stay in place for 2 weeks or longer. If adhesive strip edges start to loosen and curl up, you may trim the loose edges. Do not remove adhesive strips completely unless your health care provider tells you to do that. Check your wound every day for signs of infection. Check for: More redness, swelling,  or pain. More fluid or blood. Warmth. Pus or a bad smell. If you were sent home with a drain tube in place, follow instructions from your health care provider about: How to empty it. How to care for it at home.  General instructions Rest the affected area. Do not take baths, swim, or use a hot tub until your health care provider approves. Ask your health care provider if you may take showers. You may only be allowed to take sponge baths. Return to your normal activities as told by your health care provider. Ask your health care provider what activities are safe for you. Your health care provider may put you on activity or lifting restrictions. The incision will continue to drain. It is normal to have some clear or slightly bloody drainage. The amount of drainage should lessen each day. Do not apply any creams, ointments, or liquids unless you have been told to by your health care provider. Keep all follow-up visits as told by your health care provider. This is important. Contact a health care provider if: Your cyst or abscess returns. You have a fever or chills. You have more redness, swelling, or pain around your incision. You have more fluid or blood coming from your incision. Your incision feels warm to the touch. You have pus or a bad smell coming from your incision. You have red streaks above or below the incision site. Get help right away if: You have severe pain or bleeding. You cannot eat or drink without vomiting. You have decreased urine output. You   become short of breath. You have chest pain. You cough up blood. The affected area becomes numb or starts to tingle. These symptoms may represent a serious problem that is an emergency. Do not wait to see if the symptoms will go away. Get medical help right away. Call your local emergency services (911 in the [place] Do not drive yourself to the hospital. Summary After this procedure, it is common to have fluid, blood, or pus  coming from the surgery site. Follow all home care instructions. You will be told how to take care of your incision, how to check for infection, and how to take medicines. If you were prescribed an antibiotic medicine, take it as told by your health care provider. Do not stop taking the antibiotic even if you start to feel better. Contact a health care provider if you have increased redness, swelling, or pain around your incision. Get help right away if you have chest pain, you vomit, you cough up blood, or you have shortness of breath. Keep all follow-up visits as told by your health care provider. This is important. This information is not intended to replace advice given to you by your health care provider. Make sure you discuss any questions you have with your healthcare provider. Document Revised: 03/27/2018 Document Reviewed: 03/27/2018 Elsevier Patient Education  2022 Elsevier Inc.  

## 2021-03-19 MED ORDER — CLINDAMYCIN HCL 300 MG PO CAPS: 300.0000 mg | ORAL_CAPSULE | Freq: Three times a day (TID) | ORAL | 0 refills | Status: DC

## 2021-03-19 NOTE — Addendum Note (Signed)
Addended by: Monica Becton on: 03/19/2021 10:08 AM   Modules accepted: Orders

## 2021-04-01 ENCOUNTER — Encounter: Payer: Self-pay | Admitting: Sports Medicine

## 2021-04-01 ENCOUNTER — Other Ambulatory Visit: Payer: Self-pay | Admitting: Internal Medicine

## 2021-04-01 ENCOUNTER — Other Ambulatory Visit: Payer: Self-pay

## 2021-04-01 ENCOUNTER — Ambulatory Visit (INDEPENDENT_AMBULATORY_CARE_PROVIDER_SITE_OTHER): Payer: 59 | Admitting: Sports Medicine

## 2021-04-01 DIAGNOSIS — L02212 Cutaneous abscess of back [any part, except buttock]: Secondary | ICD-10-CM

## 2021-04-01 DIAGNOSIS — Z9189 Other specified personal risk factors, not elsewhere classified: Secondary | ICD-10-CM

## 2021-04-01 MED ORDER — CLINDAMYCIN HCL 300 MG PO CAPS: 300.0000 mg | ORAL_CAPSULE | Freq: Three times a day (TID) | ORAL | 0 refills | Status: DC

## 2021-04-01 NOTE — Progress Notes (Signed)
    Procedures performed today:    None.  Independent interpretation of notes and tests performed by another provider:   None.  Brief History, Exam, Impression, and Recommendations:    Abscess of lower back Pleasant 50 year old female, history of HIV/AIDS, T-helper count around 100. I did an incision and drainage approximately 2 weeks ago on an abscess right lower back. We wanted to use rifampin but there was a contraindication with Biktarvy. We used clindamycin instead, she returns today doing really well, packing is gone, incision is still open but no drainage, only minimal surrounding erythema. No tenderness to palpation. We will do an additional course of clindamycin, return to see me as needed.    ___________________________________________ Ihor Austin. Benjamin Stain, M.D., ABFM., CAQSM. Primary Care and Sports Medicine [facility]  Adjunct Instructor of Family Medicine  [employer]

## 2021-04-01 NOTE — Assessment & Plan Note (Signed)
Pleasant 50 year old female, history of HIV/AIDS, T-helper count around 100. I did an incision and drainage approximately 2 weeks ago on an abscess right lower back. We wanted to use rifampin but there was a contraindication with Biktarvy. We used clindamycin instead, she returns today doing really well, packing is gone, incision is still open but no drainage, only minimal surrounding erythema. No tenderness to palpation. We will do an additional course of clindamycin, return to see me as needed.

## 2021-04-06 ENCOUNTER — Other Ambulatory Visit: Payer: Self-pay

## 2021-04-06 ENCOUNTER — Ambulatory Visit (INDEPENDENT_AMBULATORY_CARE_PROVIDER_SITE_OTHER): Payer: 59 | Admitting: Internal Medicine

## 2021-04-06 ENCOUNTER — Encounter: Payer: Self-pay | Admitting: Internal Medicine

## 2021-04-06 VITALS — BP 127/83 | HR 82 | Temp 97.6°F | Wt 230.0 lb

## 2021-04-06 DIAGNOSIS — Z9189 Other specified personal risk factors, not elsewhere classified: Secondary | ICD-10-CM

## 2021-04-06 DIAGNOSIS — L02212 Cutaneous abscess of back [any part, except buttock]: Secondary | ICD-10-CM

## 2021-04-06 DIAGNOSIS — Z23 Encounter for immunization: Secondary | ICD-10-CM

## 2021-04-06 DIAGNOSIS — Z21 Asymptomatic human immunodeficiency virus [HIV] infection status: Secondary | ICD-10-CM

## 2021-04-06 NOTE — Assessment & Plan Note (Signed)
She is doing well on Biktarvy and no changes indicated.  She will continue and will check labs today.  Follow up in 3 months otherwise

## 2021-04-06 NOTE — Assessment & Plan Note (Signed)
She will continue with Bactrim 

## 2021-04-06 NOTE — Progress Notes (Signed)
   Subjective:    Patient ID: Holly Walker, adult    DOB: 08-Sep-1970, 50 y.o.   MRN: [id number]  HPI Here for follow up of HIV She was diagnosed earlier this year and started on Biktarvy.  CD4 nadir of 83 and viral load initially 379,000.  No complaints today.  CD4 last check was up to 107 and viral load down to just 86 copies.  No issues with the medication.  She has noted a rash on her right arm, not itchy.     Review of Systems  Constitutional:  Negative for fatigue.  Gastrointestinal:  Negative for diarrhea and nausea.  Skin:  Positive for rash.      Objective:   Physical Exam Eyes:     General: No scleral icterus. Pulmonary:     Effort: Pulmonary effort is normal.  Musculoskeletal:     Comments: Back area with healed I and D site, no erythema, no fluctuance  Skin:    Findings: Rash present.     Comments: Right arm with mulitiple round, minimally erythematous 2-3 mm rashes, one with the appearance of condyloma.    Neurological:     Mental Status: She is alert.  Psychiatric:        Mood and Affect: Mood normal.   SH: + tobacco       Assessment & Plan:

## 2021-04-06 NOTE — Assessment & Plan Note (Signed)
Healed now, I told her she can stop the antibiotic

## 2021-04-06 NOTE — Assessment & Plan Note (Signed)
Hepatitis B #2 today Also, flu shot given

## 2021-04-07 LAB — T-HELPER CELL (CD4) - (RCID CLINIC ONLY)
CD4 % Helper T Cell: 10 % — ABNORMAL LOW (ref 33–65)
CD4 T Cell Abs: 188 /uL — ABNORMAL LOW (ref 400–1790)

## 2021-04-08 LAB — HIV-1 RNA QUANT-NO REFLEX-BLD
HIV 1 RNA Quant: 82 Copies/mL — ABNORMAL HIGH
HIV-1 RNA Quant, Log: 1.91 Log cps/mL — ABNORMAL HIGH

## 2021-04-08 LAB — COMPLETE METABOLIC PANEL WITH GFR
AG Ratio: 1.6 (calc) (ref 1.0–2.5)
ALT: 20 U/L (ref 6–29)
AST: 20 U/L (ref 10–35)
Albumin: 4.4 g/dL (ref 3.6–5.1)
Alkaline phosphatase (APISO): 66 U/L (ref 31–125)
BUN: 15 mg/dL (ref 7–25)
CO2: 28 mmol/L (ref 20–32)
Calcium: 9.3 mg/dL (ref 8.6–10.2)
Chloride: 100 mmol/L (ref 98–110)
Creat: 0.9 mg/dL (ref 0.50–0.99)
Globulin: 2.8 g/dL (calc) (ref 1.9–3.7)
Glucose, Bld: 83 mg/dL (ref 65–99)
Potassium: 4.2 mmol/L (ref 3.5–5.3)
Sodium: 134 mmol/L — ABNORMAL LOW (ref 135–146)
Total Bilirubin: 0.4 mg/dL (ref 0.2–1.2)
Total Protein: 7.2 g/dL (ref 6.1–8.1)
eGFR: 79 mL/min/{1.73_m2} (ref >=60)

## 2021-04-27 ENCOUNTER — Other Ambulatory Visit: Payer: Self-pay | Admitting: Internal Medicine

## 2021-04-27 DIAGNOSIS — Z9189 Other specified personal risk factors, not elsewhere classified: Secondary | ICD-10-CM

## 2021-05-22 ENCOUNTER — Other Ambulatory Visit: Payer: Self-pay

## 2021-05-22 DIAGNOSIS — F649 Gender identity disorder, unspecified: Secondary | ICD-10-CM

## 2021-05-22 MED ORDER — SPIRONOLACTONE 100 MG PO TABS: ORAL_TABLET | ORAL | 0 refills | Status: DC

## 2021-05-29 ENCOUNTER — Other Ambulatory Visit: Payer: Self-pay

## 2021-05-29 DIAGNOSIS — Z789 Other specified health status: Secondary | ICD-10-CM

## 2021-05-29 MED ORDER — ESTRADIOL 2 MG PO TABS: 4.0000 mg | ORAL_TABLET | Freq: Two times a day (BID) | ORAL | 0 refills | Status: DC

## 2021-06-02 IMAGING — US US EXTREM LOW VENOUS*R*
1 series · 14 of 24 positions shown · non-contrast
Comparison: None.

CLINICAL DATA: Right lower extremity edema.

EXAM:
Right LOWER EXTREMITY VENOUS DOPPLER ULTRASOUND
TECHNIQUE: Gray-scale sonography with compression, as well as color and duplex
ultrasound, were performed to evaluate the deep venous system(s)
from the level of the common femoral vein through the popliteal and
proximal calf veins.

[Series 1: us extrem low venous*right* · 0.10mm/px · 14 of 43 slices shown]
[im 1/43]
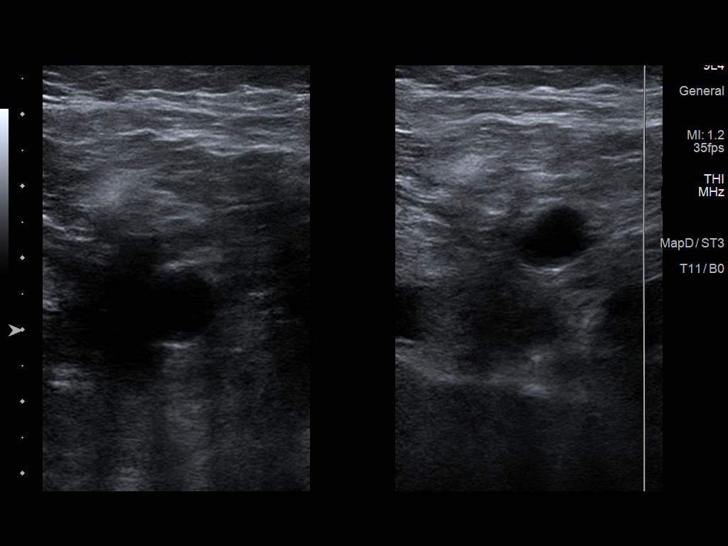
[im 4/43]
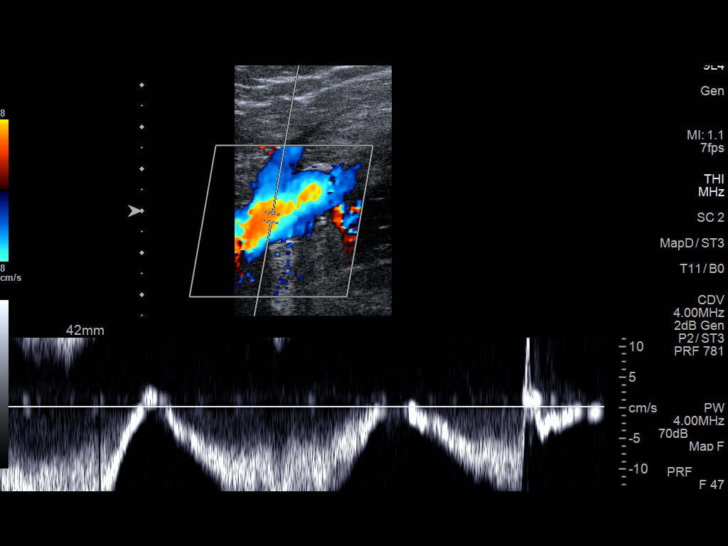
[im 8/43]
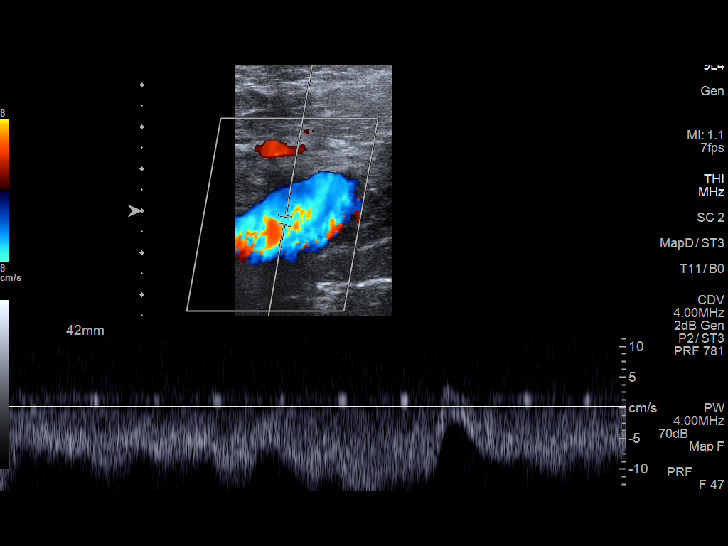
[im 11/43]
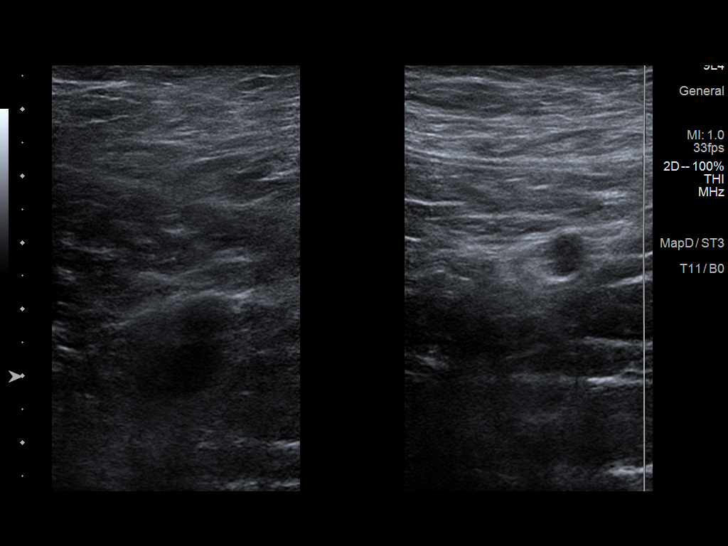
[im 13/43]
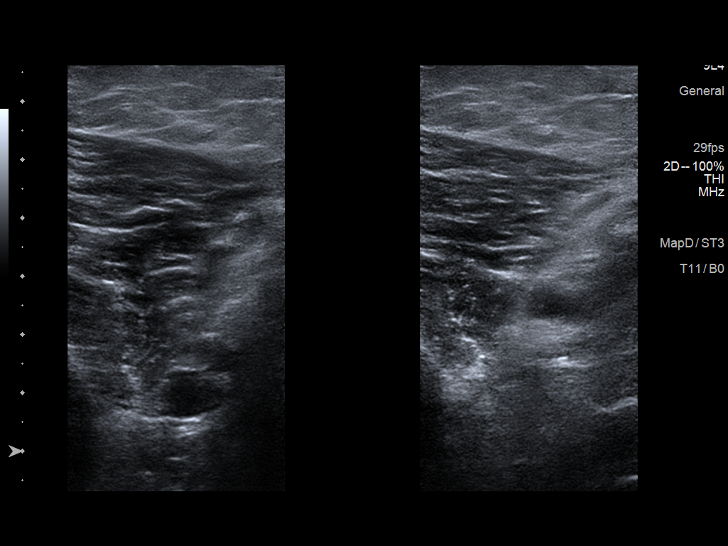
[im 17/43]
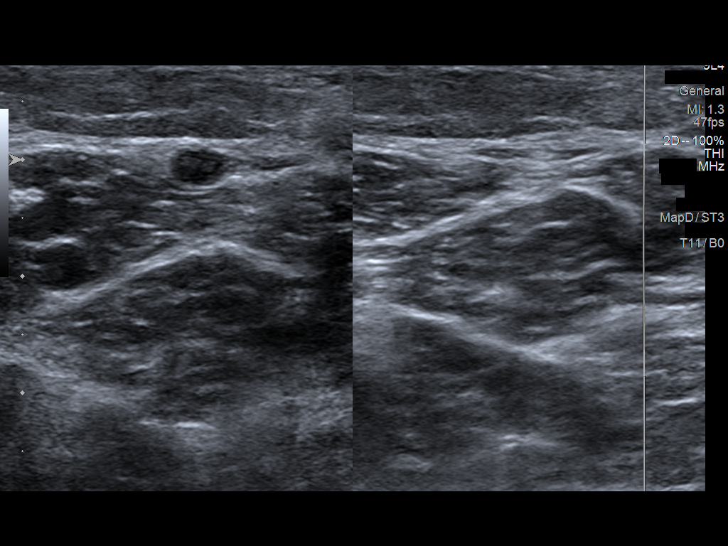
[im 21/43]
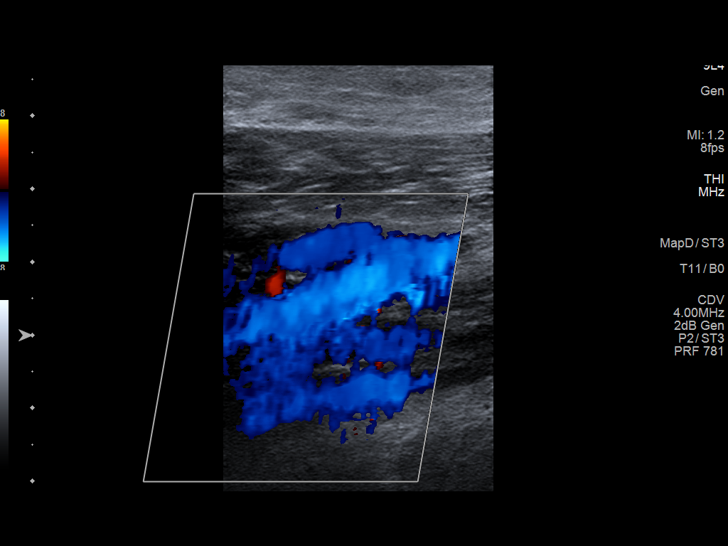
[im 22/43]
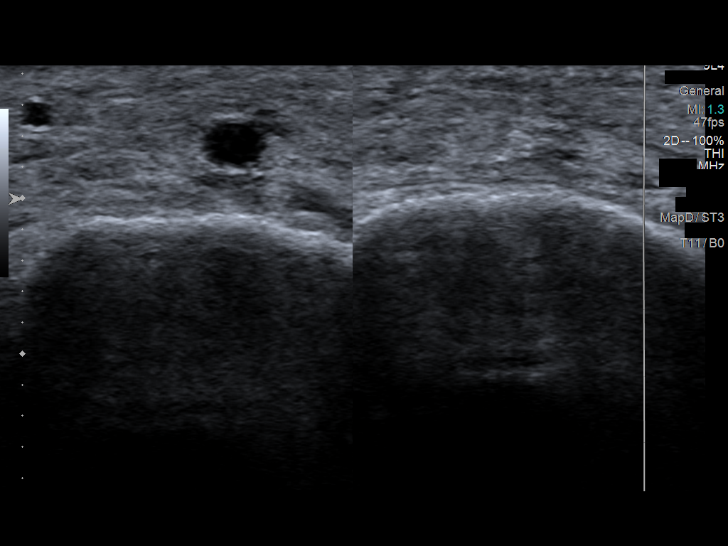
[im 26/43]
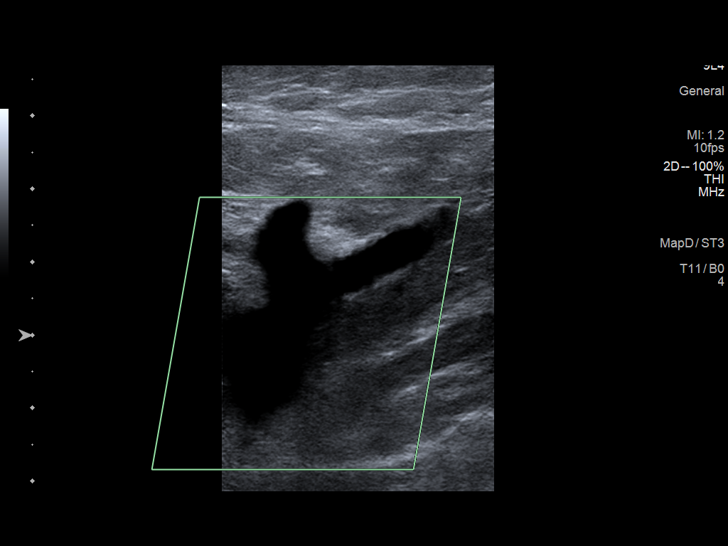
[im 30/43]
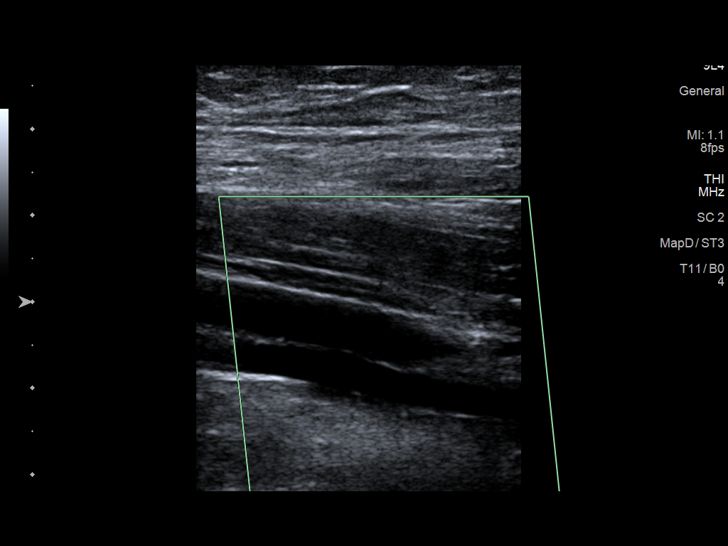
[im 33/43]
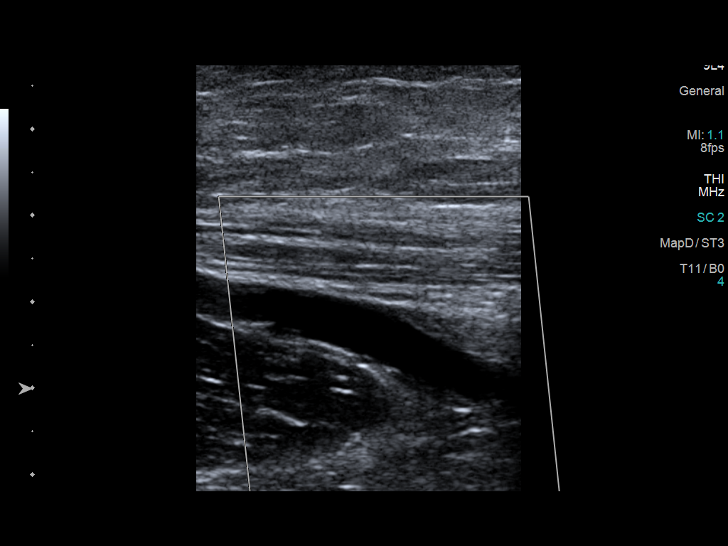
[im 35/43]
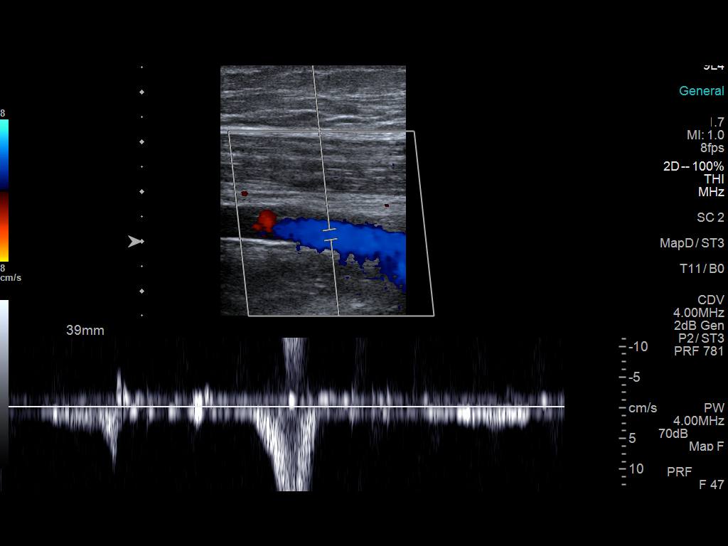
[im 39/43]
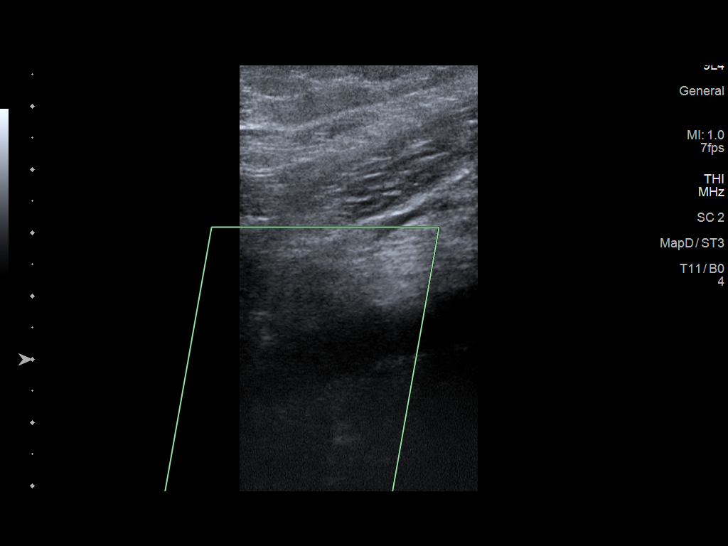
[im 43/43]
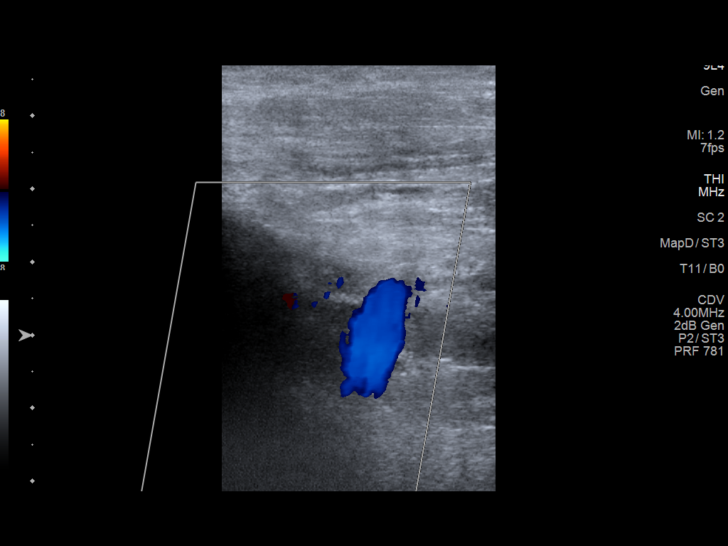

[14 of 24 positions shown; findings below may reference images not displayed]

FINDINGS: VENOUS

Normal compressibility of the common femoral, superficial femoral,
and popliteal veins, as well as the visualized calf veins.
Visualized portions of profunda femoral vein and great saphenous
vein unremarkable. No filling defects to suggest DVT on grayscale or
color Doppler imaging. Doppler waveforms show normal direction of
venous flow, normal respiratory plasticity and response to
augmentation.

Limited views of the contralateral common femoral vein are
unremarkable.

OTHER

None.

Limitations: none
IMPRESSION: Negative.

## 2021-07-08 ENCOUNTER — Other Ambulatory Visit: Payer: Self-pay

## 2021-07-08 ENCOUNTER — Ambulatory Visit: Payer: Managed Care, Other (non HMO) | Admitting: Internal Medicine

## 2021-07-08 ENCOUNTER — Encounter: Payer: Self-pay | Admitting: Internal Medicine

## 2021-07-08 VITALS — BP 133/74 | HR 90 | Temp 98.5°F | Wt 226.0 lb

## 2021-07-08 DIAGNOSIS — Z23 Encounter for immunization: Secondary | ICD-10-CM

## 2021-07-08 DIAGNOSIS — Z21 Asymptomatic human immunodeficiency virus [HIV] infection status: Secondary | ICD-10-CM

## 2021-07-08 DIAGNOSIS — Z9189 Other specified personal risk factors, not elsewhere classified: Secondary | ICD-10-CM | POA: Diagnosis not present

## 2021-07-08 DIAGNOSIS — F319 Bipolar disorder, unspecified: Secondary | ICD-10-CM | POA: Diagnosis not present

## 2021-07-08 NOTE — Progress Notes (Signed)
? ?  Subjective:  ? ? Patient ID: Holly Walker, adult    DOB: January 26, 1971, 51 y.o.   MRN: [id number] ? ?HPI ?Here for follow up of HIV ?She is on Biktarvy and no new issues.  Last CD4 188 and viral load 82 copies.  No issues with getting, taking or tolerating the medication.    She is experiencing more issues with her mental health and asking about referral to psychiatry.  She has a history of bipolar disorder that had been in remission.  She feels well otherwise with no other concerns.   ? ? ?Review of Systems  ?Constitutional:  Negative for fatigue.  ?Gastrointestinal:  Negative for diarrhea and nausea.  ?Skin:  Negative for rash.  ? ?   ?Objective:  ? Physical Exam ?Eyes:  ?   General: No scleral icterus. ?Pulmonary:  ?   Effort: Pulmonary effort is normal.  ?Skin: ?   Findings: No rash.  ?Neurological:  ?   General: No focal deficit present.  ?   Mental Status: She is alert.  ?Psychiatric:     ?   Mood and Affect: Mood normal.  ? ?SH: + tobacco ? ? ? ?   ?Assessment & Plan:  ? ? ?

## 2021-07-08 NOTE — Assessment & Plan Note (Signed)
Discussed the hepatitits A vaccine and given today ?

## 2021-07-08 NOTE — Assessment & Plan Note (Signed)
CD4 has remained under 200.  Will check today and if over 200, can stop the Bactrim prophylaxis.   ?

## 2021-07-08 NOTE — Assessment & Plan Note (Signed)
Will refer her to psychiatry.  ?

## 2021-07-08 NOTE — Assessment & Plan Note (Signed)
She is doing well and will recheck her labs today.  No concerns on the history, taking the medications well.  She can rtc in 4 months unless concerns.   ?

## 2021-07-09 ENCOUNTER — Telehealth: Payer: Self-pay

## 2021-07-09 NOTE — Telephone Encounter (Signed)
Spoke with patient, relayed that Dr. Luciana Axe would like for her to stop the Bactrim as her CD4 count has improved to 284. Patient was very excited to hear this news. ? ?Sandie Ano, RN ? ?

## 2021-07-09 NOTE — Telephone Encounter (Signed)
-----   Message from Gardiner Barefoot, MD sent at 07/09/2021  1:27 PM EST ----- ?Please let her know she can stop the Bactrim prophylaxis with her improved CD4 count.  thanks ? ?

## 2021-07-10 LAB — COMPLETE METABOLIC PANEL WITH GFR
AG Ratio: 1.6 (calc) (ref 1.0–2.5)
ALT: 17 U/L (ref 6–29)
AST: 17 U/L (ref 10–35)
Albumin: 4.5 g/dL (ref 3.6–5.1)
Alkaline phosphatase (APISO): 67 U/L (ref 31–125)
BUN: 12 mg/dL (ref 7–25)
CO2: 27 mmol/L (ref 20–32)
Calcium: 9.5 mg/dL (ref 8.6–10.2)
Chloride: 103 mmol/L (ref 98–110)
Creat: 0.89 mg/dL (ref 0.50–0.99)
Globulin: 2.8 g/dL (calc) (ref 1.9–3.7)
Glucose, Bld: 84 mg/dL (ref 65–99)
Potassium: 4.9 mmol/L (ref 3.5–5.3)
Sodium: 136 mmol/L (ref 135–146)
Total Bilirubin: 0.3 mg/dL (ref 0.2–1.2)
Total Protein: 7.3 g/dL (ref 6.1–8.1)
eGFR: 79 mL/min/{1.73_m2} (ref >=60)

## 2021-07-10 LAB — T-HELPER CELLS (CD4) COUNT (NOT AT ARMC)
Absolute CD4: 284 cells/uL — ABNORMAL LOW (ref 490–1740)
CD4 T Helper %: 12 % — ABNORMAL LOW (ref 30–61)
Total lymphocyte count: 2371 cells/uL (ref 850–3900)

## 2021-07-10 LAB — HIV-1 RNA QUANT-NO REFLEX-BLD
HIV 1 RNA Quant: <20 Copies/mL — ABNORMAL HIGH
HIV-1 RNA Quant, Log: <1.3 Log cps/mL — ABNORMAL HIGH

## 2021-09-21 ENCOUNTER — Telehealth (HOSPITAL_COMMUNITY): Payer: Self-pay | Admitting: Psychiatry

## 2021-09-21 ENCOUNTER — Encounter (HOSPITAL_COMMUNITY): Payer: Self-pay

## 2021-09-25 ENCOUNTER — Other Ambulatory Visit: Payer: Self-pay

## 2021-09-25 NOTE — Progress Notes (Signed)
Refill request declined for Valtrex. Medication not on active med list. Patient not under my care and has no appointment scheduled to transfer care to me.

## 2021-11-04 ENCOUNTER — Other Ambulatory Visit: Payer: Self-pay

## 2021-11-04 MED ORDER — VALACYCLOVIR HCL 500 MG PO TABS
500.0000 mg | ORAL_TABLET | Freq: Two times a day (BID) | ORAL | 0 refills | Status: AC
Start: 2021-11-04 — End: 2021-11-07

## 2021-11-04 NOTE — Progress Notes (Signed)
60 day refills have been sent to the pharmacy

## 2021-11-04 NOTE — Progress Notes (Signed)
Disregard the message about the 60 day supply, that was in reference to a different patient.   I sent in a 3 day supply to the pharmacy.   She has an appointment scheduled 12/25/2021.

## 2021-11-04 NOTE — Progress Notes (Signed)
Patient is scheduled for 12/25/21 @ 3:10pm - Patient requested refills to get her through to this date.Holly KitchenMarland Walker

## 2021-12-11 ENCOUNTER — Other Ambulatory Visit: Payer: Self-pay

## 2021-12-11 ENCOUNTER — Other Ambulatory Visit (HOSPITAL_COMMUNITY)
Admission: RE | Admit: 2021-12-11 | Discharge: 2021-12-11 | Disposition: A | Discharge status: Home / Self Care | Payer: Commercial Managed Care - HMO | Source: Ambulatory Visit | Attending: Internal Medicine | Admitting: Internal Medicine

## 2021-12-11 ENCOUNTER — Ambulatory Visit: Payer: Commercial Managed Care - HMO | Admitting: Internal Medicine

## 2021-12-11 ENCOUNTER — Ambulatory Visit: Payer: Commercial Managed Care - HMO

## 2021-12-11 ENCOUNTER — Encounter: Payer: Self-pay | Admitting: Internal Medicine

## 2021-12-11 VITALS — BP 129/85 | HR 90 | Temp 97.8°F | Ht 68.0 in | Wt 234.0 lb

## 2021-12-11 DIAGNOSIS — Z113 Encounter for screening for infections with a predominantly sexual mode of transmission: Secondary | ICD-10-CM

## 2021-12-11 DIAGNOSIS — Z21 Asymptomatic human immunodeficiency virus [HIV] infection status: Secondary | ICD-10-CM | POA: Diagnosis not present

## 2021-12-11 DIAGNOSIS — Z9189 Other specified personal risk factors, not elsewhere classified: Secondary | ICD-10-CM | POA: Diagnosis not present

## 2021-12-11 NOTE — Assessment & Plan Note (Signed)
Will screen 

## 2021-12-11 NOTE — Assessment & Plan Note (Signed)
CD4 has risen nicely and no further indication for Bactrim.

## 2021-12-11 NOTE — Progress Notes (Signed)
   Subjective:    Patient ID: Holly Walker, adult    DOB: [birth year], 51 y.o.   MRN: [id number]  HPI Here for follow up of HIV She continues on Biktarvy and denies any missed doses.  CD4 up to 284 last visit and viral load < 20.  No new concerns.  No issues with getting or taking Biktarvy.     Review of Systems  Constitutional:  Negative for fatigue.  Gastrointestinal:  Negative for diarrhea and nausea.       Objective:   Physical Exam Eyes:     General: No scleral icterus. Pulmonary:     Effort: Pulmonary effort is normal.  Neurological:     Mental Status: She is alert.  Psychiatric:        Mood and Affect: Mood normal.   SH: no tobacco        Assessment & Plan:

## 2021-12-11 NOTE — Assessment & Plan Note (Signed)
She is doing well with a suppressed virus and no issues with Biktarvy.  Will check her labs today and she can continue with follow up every 6 months now.

## 2021-12-14 LAB — HIV-1 RNA QUANT-NO REFLEX-BLD
HIV 1 RNA Quant: NOT DETECTED Copies/mL
HIV-1 RNA Quant, Log: NOT DETECTED Log cps/mL

## 2021-12-14 LAB — T-HELPER CELLS (CD4) COUNT (NOT AT ARMC)
Absolute CD4: 239 cells/uL — ABNORMAL LOW (ref 490–1740)
CD4 T Helper %: 13 % — ABNORMAL LOW (ref 30–61)
Total lymphocyte count: 1811 cells/uL (ref 850–3900)

## 2021-12-14 LAB — CYTOLOGY, (ORAL, ANAL, URETHRAL) ANCILLARY ONLY
Chlamydia: NEGATIVE
Chlamydia: NEGATIVE
Comment: NEGATIVE
Comment: NEGATIVE
Comment: NORMAL
Comment: NORMAL
Neisseria Gonorrhea: NEGATIVE
Neisseria Gonorrhea: NEGATIVE

## 2021-12-14 LAB — RPR: RPR Ser Ql: NONREACTIVE

## 2021-12-14 LAB — URINE CYTOLOGY ANCILLARY ONLY
Chlamydia: NEGATIVE
Comment: NEGATIVE
Comment: NORMAL
Neisseria Gonorrhea: NEGATIVE

## 2021-12-23 ENCOUNTER — Other Ambulatory Visit: Payer: Self-pay

## 2021-12-23 NOTE — Telephone Encounter (Signed)
[facility] in [place] called requesting a refill.  The patient has an upcoming appointment to establish care with you on 12/25/2021.

## 2021-12-24 MED ORDER — VALACYCLOVIR HCL 500 MG PO TABS: 500.0000 mg | ORAL_TABLET | Freq: Two times a day (BID) | ORAL | 0 refills | Status: DC

## 2021-12-25 ENCOUNTER — Encounter: Payer: Self-pay | Admitting: Medical-Surgical

## 2021-12-25 ENCOUNTER — Ambulatory Visit (INDEPENDENT_AMBULATORY_CARE_PROVIDER_SITE_OTHER): Payer: Commercial Managed Care - HMO | Admitting: Medical-Surgical

## 2021-12-25 VITALS — BP 131/78 | HR 94 | Wt 238.1 lb

## 2021-12-25 DIAGNOSIS — I10 Essential (primary) hypertension: Secondary | ICD-10-CM | POA: Diagnosis not present

## 2021-12-25 DIAGNOSIS — R21 Rash and other nonspecific skin eruption: Secondary | ICD-10-CM

## 2021-12-25 DIAGNOSIS — A6000 Herpesviral infection of urogenital system, unspecified: Secondary | ICD-10-CM

## 2021-12-25 DIAGNOSIS — L659 Nonscarring hair loss, unspecified: Secondary | ICD-10-CM | POA: Diagnosis not present

## 2021-12-25 DIAGNOSIS — Z789 Other specified health status: Secondary | ICD-10-CM | POA: Diagnosis not present

## 2021-12-25 DIAGNOSIS — Z7689 Persons encountering health services in other specified circumstances: Secondary | ICD-10-CM

## 2021-12-25 DIAGNOSIS — F649 Gender identity disorder, unspecified: Secondary | ICD-10-CM

## 2021-12-25 DIAGNOSIS — Z1211 Encounter for screening for malignant neoplasm of colon: Secondary | ICD-10-CM

## 2021-12-25 MED ORDER — AMLODIPINE BESYLATE 5 MG PO TABS: ORAL_TABLET | ORAL | 3 refills | Status: DC

## 2021-12-25 MED ORDER — SPIRONOLACTONE 100 MG PO TABS: ORAL_TABLET | ORAL | 3 refills | Status: DC

## 2021-12-25 MED ORDER — FINASTERIDE 5 MG PO TABS: 5.0000 mg | ORAL_TABLET | Freq: Every day | ORAL | 3 refills | Status: DC

## 2021-12-25 MED ORDER — VALACYCLOVIR HCL 500 MG PO TABS: 500.0000 mg | ORAL_TABLET | Freq: Two times a day (BID) | ORAL | 5 refills | Status: DC

## 2021-12-25 MED ORDER — ESTRADIOL 2 MG PO TABS: 4.0000 mg | ORAL_TABLET | Freq: Two times a day (BID) | ORAL | 0 refills | Status: DC

## 2021-12-25 MED ORDER — MINOXIDIL 5 % EX FOAM: 1.0000 mL | Freq: Two times a day (BID) | CUTANEOUS | 3 refills | Status: AC

## 2021-12-25 NOTE — Progress Notes (Unsigned)
Established Patient Office Visit  Subjective   Patient ID: Holly Walker, transgender female   DOB: Aug 01, 1970 Age: 51 y.o. MRN: [id number]   Chief Complaint  Patient presents with   Transitions Of Care   HPI Pleasant 51 year old transgender female presenting today to transfer care to a new PCP and for the following:  HTN: taking Amlodipine 5mg  daily, tolerating well without side effects. Not checking BP at home. Limits dietary sodium. Not exercising. Denies CP, SOB, palpitations, lower extremity edema, dizziness, headaches, or vision changes.  Transgender: taking estradiol 4mg  twice daily and Spironolactone 100mg  daily, tolerating well without side effects. Has been on this regimen for about a year and is due for labs. No surgeries but did have "illegal" silicone injections many years ago.   Herpes: taking Valtrex on a prn basis. Did not tolerate daily suppressive doses. Notes about 1 breakout per month.   Hair loss: using Minoxidil topically once daily. Taking finasteride 5mg  daily. Feels this has continued working well.   Rash: seeing ID for HIV treatment. Since starting Biktarvy, has had a diffuse nonpruritic rash on her upper extremities and neck. Wants to see dermatology.    Objective:    Vitals:   12/25/21 1530  BP: 131/78  Pulse: 94  Weight: 238 lb 1.9 oz (108 kg)  SpO2: 95%  BMI (Calculated): 36.21    Physical Exam Vitals and nursing note reviewed.  Constitutional:      General: She is not in acute distress.    Appearance: Normal appearance. She is obese. She is not ill-appearing.  HENT:     Head: Normocephalic and atraumatic.  Cardiovascular:     Rate and Rhythm: Normal rate and regular rhythm.     Pulses: Normal pulses.     Heart sounds: Normal heart sounds.  Pulmonary:     Effort: Pulmonary effort is normal. No respiratory distress.     Breath sounds: Normal breath sounds. No wheezing, rhonchi or rales.  Skin:    General: Skin is warm and dry.   Neurological:     Mental Status: She is alert and oriented to person, place, and time.  Psychiatric:        Mood and Affect: Mood normal.        Behavior: Behavior normal.        Thought Content: Thought content normal.        Judgment: Judgment normal.   No results found for this or any previous visit (from the past 24 hour(s)).     The 10-year ASCVD risk score (Arnett DK, et al., 2019) is: 4.6%   Values used to calculate the score:     Age: 51 years     Sex: Female     Is Non-Hispanic African American: No     Diabetic: No     Tobacco smoker: Yes     Systolic Blood Pressure: 131 mmHg     Is BP treated: Yes     HDL Cholesterol: 46 mg/dL     Total Cholesterol: 166 mg/dL   Assessment & Plan:   1. Hypertension goal BP (blood pressure) < 130/80 Checking labs. BP at goal. Continue Amlodipine 5mg  daily. Recommend monitoring BP at home with a goal of 130/80 or less. Low sodium diet, regular intentional exercise, and weight loss recommended.  - amLODipine (NORVASC) 5 MG tablet; TAKE 1 TABLET(5 MG) BY MOUTH DAILY  Dispense: 90 tablet; Refill: 3 - CBC with Differential/Platelet - COMPLETE METABOLIC PANEL WITH GFR - Lipid panel  2. 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 Continue Estradiol 4mg  BID and spironolactone 100mg  daily.  - estradiol (ESTRACE) 2 MG tablet; Take 2 tablets (4 mg total) by mouth in the morning and at bedtime.  Dispense: 360 tablet; Refill: 0 - Lipid panel - Estradiol - Testosterone  3. Hair loss Continue finasteride and minoxidil as prescribed.  - finasteride (PROSCAR) 5 MG tablet; Take 1 tablet (5 mg total) by mouth daily.  Dispense: 90 tablet; Refill: 3 - Minoxidil 5 % FOAM; Apply 1 mL topically 2 (two) times daily.  Dispense: 180 g; Refill: 3  4. Genital Herpes Continue Valtrex 500mg  BID x 3-5 days as needed for outbreaks.  5. Rash Unclear etiology. Referring to dermatology.  - Ambulatory referral  to Dermatology  6. Colon cancer screening Referring to GI for colonoscopy.  - Ambulatory referral to Gastroenterology  Return in about 6 months (around 06/27/2022) for chronic disease follow up.  ___________________________________________ , DNP, APRN, FNP-BC Primary Care and Sports Medicine [facility]

## 2021-12-26 DIAGNOSIS — F649 Gender identity disorder, unspecified: Secondary | ICD-10-CM | POA: Insufficient documentation

## 2021-12-30 ENCOUNTER — Encounter: Payer: Self-pay | Admitting: Gastroenterology

## 2022-01-01 LAB — COMPLETE METABOLIC PANEL WITH GFR
AG Ratio: 1.7 (calc) (ref 1.0–2.5)
ALT: 22 U/L (ref 9–46)
AST: 20 U/L (ref 10–40)
Albumin: 4.4 g/dL (ref 3.6–5.1)
Alkaline phosphatase (APISO): 73 U/L (ref 36–130)
BUN: 15 mg/dL (ref 7–25)
CO2: 27 mmol/L (ref 20–32)
Calcium: 9.6 mg/dL (ref 8.6–10.3)
Chloride: 103 mmol/L (ref 98–110)
Creat: 0.94 mg/dL (ref 0.60–1.29)
Globulin: 2.6 g/dL (calc) (ref 1.9–3.7)
Glucose, Bld: 82 mg/dL (ref 65–99)
Potassium: 4.7 mmol/L (ref 3.5–5.3)
Sodium: 137 mmol/L (ref 135–146)
Total Bilirubin: 0.4 mg/dL (ref 0.2–1.2)
Total Protein: 7 g/dL (ref 6.1–8.1)
eGFR: 99 mL/min/{1.73_m2} (ref >=60)

## 2022-01-01 LAB — CBC WITH DIFFERENTIAL/PLATELET
Absolute Monocytes: 725 cells/uL (ref 200–950)
Basophils Absolute: 37 cells/uL (ref 0–200)
Basophils Relative: 0.4 %
Eosinophils Absolute: 121 cells/uL (ref 15–500)
Eosinophils Relative: 1.3 %
HCT: 47.8 % (ref 38.5–50.0)
Hemoglobin: 16.9 g/dL (ref 13.2–17.1)
Lymphs Abs: 1600 cells/uL (ref 850–3900)
MCH: 37 pg — ABNORMAL HIGH (ref 27.0–33.0)
MCHC: 35.4 g/dL (ref 32.0–36.0)
MCV: 104.6 fL — ABNORMAL HIGH (ref 80.0–100.0)
MPV: 9.7 fL (ref 7.5–12.5)
Monocytes Relative: 7.8 %
Neutro Abs: 6817 cells/uL (ref 1500–7800)
Neutrophils Relative %: 73.3 %
Platelets: 235 10*3/uL (ref 140–400)
RBC: 4.57 10*6/uL (ref 4.20–5.80)
RDW: 12.4 % (ref 11.0–15.0)
Total Lymphocyte: 17.2 %
WBC: 9.3 10*3/uL (ref 3.8–10.8)

## 2022-01-01 LAB — B12 AND FOLATE PANEL
Folate: 20.3 ng/mL
Vitamin B-12: 1335 pg/mL — ABNORMAL HIGH (ref 200–1100)

## 2022-01-01 LAB — LIPID PANEL
Cholesterol: 166 mg/dL (ref <200)
HDL: 46 mg/dL (ref >=40)
LDL Cholesterol (Calc): 99 mg/dL (calc)
Non-HDL Cholesterol (Calc): 120 mg/dL (calc) (ref <130)
Total CHOL/HDL Ratio: 3.6 (calc) (ref <5.0)
Triglycerides: 115 mg/dL (ref <150)

## 2022-01-01 LAB — ESTRADIOL: Estradiol: 65 pg/mL — ABNORMAL HIGH (ref <=39)

## 2022-01-01 LAB — TESTOSTERONE, TOTAL, LC/MS/MS: Testosterone, Total, LC-MS-MS: 251 ng/dL — ABNORMAL HIGH (ref 2–45)

## 2022-01-07 NOTE — Progress Notes (Signed)
The size of blood cells can be affected by multiple things including vitamin B12 deficiency, folate deficiency, or treatment with certain drugs.  In this case, it is likely that the increased size of the red blood cells is related to estradiol and hormone replacement therapy as B12 and folate levels look great.  This is a benign finding in these cases and requires no therapy.  We will just continue to monitor this with future lab draws.

## 2022-01-15 ENCOUNTER — Ambulatory Visit (AMBULATORY_SURGERY_CENTER): Payer: Self-pay | Admitting: *Deleted

## 2022-01-15 VITALS — Ht 68.0 in | Wt 235.0 lb

## 2022-01-15 DIAGNOSIS — Z1211 Encounter for screening for malignant neoplasm of colon: Secondary | ICD-10-CM

## 2022-01-15 MED ORDER — ONDANSETRON HCL 4 MG PO TABS: 4.0000 mg | ORAL_TABLET | ORAL | 0 refills | Status: DC

## 2022-01-15 MED ORDER — NA SULFATE-K SULFATE-MG SULF 17.5-3.13-1.6 GM/177ML PO SOLN: 1.0000 | ORAL | 0 refills | Status: DC

## 2022-01-15 NOTE — Progress Notes (Signed)
Patient is here in-person for PV. Patient denies any allergies to eggs or soy. Patient denies any problems with anesthesia/sedation. Patient is not on any oxygen at home. Patient is not taking any diet/weight loss medications or blood thinners. Went over procedure prep instructions with the patient. Patient is aware of our care-partner policy. Patient notified to use Good-Rx for prescription.   Patient denies constipation.   

## 2022-01-20 ENCOUNTER — Other Ambulatory Visit: Payer: Self-pay

## 2022-01-20 DIAGNOSIS — L659 Nonscarring hair loss, unspecified: Secondary | ICD-10-CM

## 2022-01-20 DIAGNOSIS — I10 Essential (primary) hypertension: Secondary | ICD-10-CM

## 2022-01-20 MED ORDER — FINASTERIDE 5 MG PO TABS: 5.0000 mg | ORAL_TABLET | Freq: Every day | ORAL | 3 refills | Status: DC

## 2022-01-20 MED ORDER — AMLODIPINE BESYLATE 5 MG PO TABS: ORAL_TABLET | ORAL | 3 refills | Status: DC

## 2022-01-21 ENCOUNTER — Other Ambulatory Visit: Payer: Self-pay | Admitting: Internal Medicine

## 2022-01-25 ENCOUNTER — Encounter: Payer: Self-pay | Admitting: Gastroenterology

## 2022-02-09 ENCOUNTER — Encounter: Payer: Self-pay | Admitting: Gastroenterology

## 2022-02-09 ENCOUNTER — Ambulatory Visit (AMBULATORY_SURGERY_CENTER): Payer: Commercial Managed Care - HMO | Admitting: Gastroenterology

## 2022-02-09 VITALS — BP 127/71 | HR 87 | Temp 98.2°F | Resp 11 | Ht 68.0 in | Wt 235.0 lb

## 2022-02-09 DIAGNOSIS — Z1211 Encounter for screening for malignant neoplasm of colon: Secondary | ICD-10-CM | POA: Diagnosis present

## 2022-02-09 DIAGNOSIS — K5730 Diverticulosis of large intestine without perforation or abscess without bleeding: Secondary | ICD-10-CM

## 2022-02-09 MED ORDER — SODIUM CHLORIDE 0.9 % IV SOLN: 500.0000 mL | Freq: Once | INTRAVENOUS | Status: DC

## 2022-02-09 NOTE — Progress Notes (Signed)
To pacu, VSS. Report to [email or web address].tb 

## 2022-02-09 NOTE — Patient Instructions (Signed)
   Handout on diverticulosis given to you today  No biopsies done     YOU HAD AN ENDOSCOPIC PROCEDURE TODAY AT [place]:   Refer to the procedure report that was given to you for any specific questions about what was found during the examination.  If the procedure report does not answer your questions, please call your gastroenterologist to clarify.  If you requested that your care partner not be given the details of your procedure findings, then the procedure report has been included in a sealed envelope for you to review at your convenience later.  YOU SHOULD EXPECT: Some feelings of bloating in the abdomen. Passage of more gas than usual.  Walking can help get rid of the air that was put into your GI tract during the procedure and reduce the bloating. If you had a lower endoscopy (such as a colonoscopy or flexible sigmoidoscopy) you may notice spotting of blood in your stool or on the toilet paper. If you underwent a bowel prep for your procedure, you may not have a normal bowel movement for a few days.  Please Note:  You might notice some irritation and congestion in your nose or some drainage.  This is from the oxygen used during your procedure.  There is no need for concern and it should clear up in a day or so.  SYMPTOMS TO REPORT IMMEDIATELY:  Following lower endoscopy (colonoscopy or flexible sigmoidoscopy):  Excessive amounts of blood in the stool  Significant tenderness or worsening of abdominal pains  Swelling of the abdomen that is new, acute  Fever of 100F or higher   For urgent or emergent issues, a gastroenterologist can be reached at any hour by calling [phone]. Do not use MyChart messaging for urgent concerns.    DIET:  We do recommend a small meal at first, but then you may proceed to your regular diet.  Drink plenty of fluids but you should avoid alcoholic beverages for 24 hours.  ACTIVITY:  You should plan to take it easy for the rest of  today and you should NOT DRIVE or use heavy machinery until tomorrow (because of the sedation medicines used during the test).    FOLLOW UP: Our staff will call the number listed on your records the next business day following your procedure.  We will call around 7:15- 8:00 am to check on you and address any questions or concerns that you may have regarding the information given to you following your procedure. If we do not reach you, we will leave a message.     If any biopsies were taken you will be contacted by phone or by letter within the next 1-3 weeks.  Please call us at [phone] if you have not heard about the biopsies in 3 weeks.    SIGNATURES/CONFIDENTIALITY: You and/or your care partner have signed paperwork which will be entered into your electronic medical record.  These signatures attest to the fact that that the information above on your After Visit Summary has been reviewed and is understood.  Full responsibility of the confidentiality of this discharge information lies with you and/or your care-partner.

## 2022-02-09 NOTE — Progress Notes (Signed)
Pt's states no medical or surgical changes since previsit or office visit. 

## 2022-02-09 NOTE — Op Note (Signed)
[facility] Patient Name: Holly Walker Procedure Date: 02/09/2022 8:57 AM MRN: [id number] Endoscopist: Gerrit Heck , MD Age: 51 Referring MD:  Date of Birth: 08-11-1970 Gender: Female Account #: [id number] Procedure:                Colonoscopy Indications:              Screening for colorectal malignant neoplasm                           Colonoscopy approximately 20 years ago was normal                            per patient. Otherwise, no active GI symptoms. Medicines:                Monitored Anesthesia Care Procedure:                Pre-Anesthesia Assessment:                           - Prior to the procedure, a History and Physical                            was performed, and patient medications and                            allergies were reviewed. The patient's tolerance of                            previous anesthesia was also reviewed. The risks                            and benefits of the procedure and the sedation                            options and risks were discussed with the patient.                            All questions were answered, and informed consent                            was obtained. Prior Anticoagulants: The patient has                            taken no previous anticoagulant or antiplatelet                            agents. ASA Grade Assessment: II - A patient with                            mild systemic disease. After reviewing the risks                            and benefits, the patient was deemed in  satisfactory condition to undergo the procedure.                           After obtaining informed consent, the colonoscope                            was passed under direct vision. Throughout the                            procedure, the patient's blood pressure, pulse, and                            oxygen saturations were monitored continuously. The                            CF [id number]  [id number] was introduced through the anus                            and advanced to the the terminal ileum. The                            colonoscopy was performed without difficulty. The                            patient tolerated the procedure well. The quality                            of the bowel preparation was good. The terminal                            ileum, ileocecal valve, appendiceal orifice, and                            rectum were photographed. Scope In: 9:05:27 AM Scope Out: 9:17:09 AM Scope Withdrawal Time: 0 hours 9 minutes 7 seconds  Total Procedure Duration: 0 hours 11 minutes 42 seconds  Findings:                 The perianal and digital rectal examinations were                            normal.                           A few small-mouthed diverticula were found in the                            sigmoid colon.                           The exam was otherwise normal throughout the                            remainder of the colon.  The retroflexed view of the distal rectum and anal                            verge was normal and showed no anal or rectal                            abnormalities.                           The terminal ileum appeared normal. Complications:            No immediate complications. Estimated Blood Loss:     Estimated blood loss: none. Impression:               - Diverticulosis in the sigmoid colon.                           - The distal rectum and anal verge are normal on                            retroflexion view.                           - The examined portion of the ileum was normal.                           - No specimens collected. Recommendation:           - Patient has a contact number available for                            emergencies. The signs and symptoms of potential                            delayed complications were discussed with the                            patient. Return to  normal activities tomorrow.                            Written discharge instructions were provided to the                            patient.                           - Resume previous diet.                           - Continue present medications.                           - Repeat colonoscopy in 10 years for screening                            purposes.                           -  Return to GI office PRN. Gerrit Heck, MD 02/09/2022 9:23:45 AM

## 2022-02-09 NOTE — Progress Notes (Signed)
GASTROENTEROLOGY PROCEDURE H&P NOTE   Primary Care Physician: Christen Butter, NP    Reason for Procedure:  Colon Cancer screening  Plan:    Colonoscopy  Patient is appropriate for endoscopic procedure(s) in the ambulatory ([facility]) setting.  The nature of the procedure, as well as the risks, benefits, and alternatives were carefully and thoroughly reviewed with the patient. Ample time for discussion and questions allowed. The patient understood, was satisfied, and agreed to proceed.     HPI: Holly Walker is a 51 y.o. adult who presents for colonoscopy for routine Colon Cancer screening.  No active GI symptoms.  No known family history of colon cancer or related malignancy.     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 Authorizing Provider  amLODipine (NORVASC) 5 MG tablet TAKE 1 TABLET(5 MG) BY MOUTH DAILY 01/20/22  Yes Christen Butter, NP  aspirin EC 81 MG tablet Take 1 tablet (81 mg total) by mouth daily. 01/05/19  Yes Carlis Stable, PA-C  BIKTARVY 50-200-25 MG TABS tablet TAKE 1 TABLET BY MOUTH DAILY 01/21/22  Yes Comer, Belia Heman, MD  Cyanocobalamin (VITAMIN B-12 PO) Take by mouth.   Yes [provider]  estradiol (ESTRACE) 2 MG tablet Take 2 tablets (4 mg total) by mouth in the morning and at bedtime. 12/25/21 03/25/22 Yes Christen Butter, NP  finasteride (PROSCAR) 5 MG tablet Take 1 tablet (5 mg total) by mouth daily. 01/20/22  Yes Christen Butter, NP   Minoxidil 5 % FOAM Apply 1 mL topically 2 (two) times daily. 12/25/21  Yes Christen Butter, NP  Multiple Vitamin (MULTIVITAMIN) tablet Take 1 tablet by mouth daily.   Yes [provider]  ondansetron (ZOFRAN) 4 MG tablet Take 1 tablet (4 mg total) by mouth as directed. Take one Zofran pill 30-60 minutes before each colonoscopy prep dose 01/15/22  Yes Cassady Turano V, DO  Pyridoxine HCl (VITAMIN B6) 100 MG TABS Take by mouth.   Yes [provider]  spironolactone (ALDACTONE) 100 MG tablet TAKE 1 TABLET(100 MG) BY MOUTH DAILY. 12/25/21  Yes Christen Butter, NP  VITAMIN D PO Take by mouth.   Yes [provider]  valACYclovir (VALTREX) 500 MG tablet Take 1 tablet (500 mg total) by mouth 2 (two) times daily. 12/25/21   Christen Butter, NP    Current Outpatient Medications  Medication Sig Dispense Refill   amLODipine (NORVASC) 5 MG tablet TAKE 1 TABLET(5 MG) BY MOUTH DAILY 90 tablet 3   aspirin EC 81 MG tablet Take 1 tablet (81 mg total) by mouth daily. 90 tablet 3   BIKTARVY 50-200-25 MG TABS tablet TAKE 1 TABLET BY MOUTH DAILY 30 tablet 5   Cyanocobalamin (VITAMIN B-12 PO) Take by mouth.     estradiol (ESTRACE) 2 MG tablet Take 2 tablets (4 mg total) by  mouth in the morning and at bedtime. 360 tablet 0   finasteride (PROSCAR) 5 MG tablet Take 1 tablet (5 mg total) by mouth daily. 90 tablet 3   Minoxidil 5 % FOAM Apply 1 mL topically 2 (two) times daily. 180 g 3   Multiple Vitamin (MULTIVITAMIN) tablet Take 1 tablet by mouth daily.     ondansetron (ZOFRAN) 4 MG tablet Take 1 tablet (4 mg total) by mouth as directed. Take one Zofran pill 30-60 minutes before each colonoscopy prep dose 2 tablet 0   Pyridoxine HCl (VITAMIN B6) 100 MG TABS Take by mouth.     spironolactone (ALDACTONE) 100 MG tablet TAKE 1 TABLET(100 MG) BY MOUTH DAILY. 90 tablet 3   VITAMIN D PO Take by mouth.     valACYclovir (VALTREX) 500 MG tablet Take 1 tablet (500 mg total) by mouth 2 (two) times daily. 18 tablet 5    Current Facility-Administered Medications  Medication Dose Route Frequency Provider Last Rate Last Admin   0.9 %  sodium chloride infusion  500 mL Intravenous Once Irvine Glorioso V, DO        Allergies as of 02/09/2022   (No Known Allergies)    Family History  Problem Relation Age of Onset   Breast cancer Paternal Aunt    Colon polyps Maternal Grandmother    Stroke Maternal Grandmother    Skin cancer Maternal Grandmother    Colon polyps Maternal Grandfather    Heart attack Maternal Grandfather    Valvular heart disease Maternal Grandfather    Diabetes Maternal Grandfather    Diabetes Paternal Grandmother    Stroke Paternal Grandfather    Leukemia Other    Colon cancer Neg Hx    Esophageal cancer Neg Hx    Stomach cancer Neg Hx    Rectal cancer Neg Hx     Social History   Socioeconomic History   Marital status: Media planner    Spouse name: Dominik   Number of children: Not on file   Years of education: Not on file   Highest education level: Not on file  Occupational History   Not on file  Tobacco Use   Smoking status: Every Day    Packs/day: 1.00    Years: 20.00    Total pack years: 20.00    Types: Cigarettes   Smokeless tobacco: Never   Tobacco comments:    States she wants to quit  Vaping Use   Vaping Use: Former  Substance and Sexual Activity   Alcohol use: Yes    Alcohol/week: 1.0 standard drink of alcohol    Types: 1 Glasses of wine per week    Comment: wine or mixed drink occasionally   Drug use: Yes    Frequency: 7.0 times per week    Types: Marijuana    Comment: daily   Sexual activity: Yes    Birth control/protection: None    Comment: accepted condoms  Other Topics Concern   Not on file  Social History Narrative   Not on file   Social Determinants of Health   Financial Resource Strain: Not on file  Food Insecurity: Not on file  Transportation Needs: Not on file  Physical Activity: Not on file  Stress: Not on file  Social  Connections: Not on file  Intimate Partner Violence: Not on file    Physical Exam: Vital signs in last 24 hours: @BP  109/73   Pulse 88   Temp 98.2 F (36.8 C)   Ht 5\' 8"  (1.727  m)   Wt 235 lb (106.6 kg)   SpO2 99%   BMI 35.73 kg/m  GEN: NAD EYE: Sclerae anicteric ENT: MMM CV: Non-tachycardic Pulm: CTA b/l GI: Soft, NT/ND NEURO:  Alert & Oriented x 3   Gerrit Heck, DO [facility]   02/09/2022 8:56 AM

## 2022-02-10 ENCOUNTER — Telehealth: Payer: Self-pay

## 2022-02-10 NOTE — Telephone Encounter (Signed)
  Follow up Call-     02/09/2022    8:10 AM  Call back number  Post procedure Call Back phone  # [phone]  Permission to leave phone message Yes     Patient questions:  Do you have a fever, pain , or abdominal swelling? No. Pain Score  0 *  Have you tolerated food without any problems? Yes.    Have you been able to return to your normal activities? Yes.    Do you have any questions about your discharge instructions: Diet   No. Medications  No. Follow up visit  No.  Do you have questions or concerns about your Care? No.  Actions: * If pain score is 4 or above: No action needed, pain <4.

## 2022-03-22 ENCOUNTER — Other Ambulatory Visit: Payer: Self-pay | Admitting: Medical-Surgical

## 2022-03-22 DIAGNOSIS — Z789 Other specified health status: Secondary | ICD-10-CM

## 2022-06-30 ENCOUNTER — Other Ambulatory Visit: Payer: Self-pay

## 2022-06-30 ENCOUNTER — Ambulatory Visit (INDEPENDENT_AMBULATORY_CARE_PROVIDER_SITE_OTHER): Payer: Commercial Managed Care - HMO | Admitting: Internal Medicine

## 2022-06-30 ENCOUNTER — Encounter: Payer: Self-pay | Admitting: Internal Medicine

## 2022-06-30 ENCOUNTER — Other Ambulatory Visit (HOSPITAL_COMMUNITY)
Admission: RE | Admit: 2022-06-30 | Discharge: 2022-06-30 | Disposition: A | Discharge status: Home / Self Care | Payer: Commercial Managed Care - HMO | Source: Ambulatory Visit | Attending: Internal Medicine | Admitting: Internal Medicine

## 2022-06-30 VITALS — BP 144/89 | HR 94 | Temp 97.0°F | Ht 68.0 in | Wt 246.0 lb

## 2022-06-30 DIAGNOSIS — Z21 Asymptomatic human immunodeficiency virus [HIV] infection status: Secondary | ICD-10-CM | POA: Insufficient documentation

## 2022-06-30 DIAGNOSIS — Z113 Encounter for screening for infections with a predominantly sexual mode of transmission: Secondary | ICD-10-CM | POA: Insufficient documentation

## 2022-06-30 DIAGNOSIS — Z79899 Other long term (current) drug therapy: Secondary | ICD-10-CM

## 2022-06-30 MED ORDER — BIKTARVY 50-200-25 MG PO TABS: 1.0000 | ORAL_TABLET | Freq: Every day | ORAL | 11 refills | Status: DC

## 2022-07-01 ENCOUNTER — Encounter: Payer: Self-pay | Admitting: Internal Medicine

## 2022-07-01 LAB — T-HELPER CELL (CD4) - (RCID CLINIC ONLY)
CD4 % Helper T Cell: 18 % — ABNORMAL LOW (ref 33–65)
CD4 T Cell Abs: 397 /uL — ABNORMAL LOW (ref 400–1790)

## 2022-07-01 NOTE — Assessment & Plan Note (Signed)
Will screen

## 2022-07-01 NOTE — Assessment & Plan Note (Signed)
She continues to do well with no concerns.  No changes indicated and will check labs today. Rtc in 6 months.

## 2022-07-01 NOTE — Progress Notes (Signed)
   Subjective:    Patient ID: Holly Walker, adult    DOB: 1970-05-23, 52 y.o.   MRN: [id number]  HPI Allis is here for follow up of HIV She continues on Biktarvy with no missed doses.  She has remained suppressed and no concerns today.  No issues with getting or taking her medication.    Review of Systems  Constitutional:  Negative for fatigue.  Gastrointestinal:  Negative for diarrhea and nausea.  Skin:  Negative for rash.       Objective:   Physical Exam Eyes:     General: No scleral icterus. Pulmonary:     Effort: Pulmonary effort is normal.  Neurological:     Mental Status: She is alert.   SH: + tobacco        Assessment & Plan:

## 2022-07-01 NOTE — Assessment & Plan Note (Signed)
With new research coming out by way of the Pett[place] study regarding cardiovascular event risk reduction for PLWH, initiation of statin (pitavastatin) therapy was shown to reduce CV disease by 35% independent of other risk factors.   The 10-year ASCVD risk score (Arnett DK, et al., 2019) is: 5.8%   Values used to calculate the score:     Age: 52 years     Sex: Female     Is Non-Hispanic African American: No     Diabetic: No     Tobacco smoker: Yes     Systolic Blood Pressure: 123456 mmHg     Is BP treated: Yes     HDL Cholesterol: 46 mg/dL     Total Cholesterol: 166 mg/dL  Next visit I will discuss with the patient that her 10-year CVD Risk Score to be at least moderately elevated, and would benefit from intervention given HIV+ and > 59 yo.

## 2022-07-02 ENCOUNTER — Ambulatory Visit: Payer: Commercial Managed Care - HMO | Admitting: Medical-Surgical

## 2022-07-02 ENCOUNTER — Encounter: Payer: Self-pay | Admitting: Medical-Surgical

## 2022-07-02 VITALS — BP 128/86 | HR 97 | Resp 18 | Ht 68.0 in | Wt 246.1 lb

## 2022-07-02 DIAGNOSIS — L719 Rosacea, unspecified: Secondary | ICD-10-CM | POA: Insufficient documentation

## 2022-07-02 DIAGNOSIS — Z1231 Encounter for screening mammogram for malignant neoplasm of breast: Secondary | ICD-10-CM

## 2022-07-02 DIAGNOSIS — I10 Essential (primary) hypertension: Secondary | ICD-10-CM | POA: Diagnosis not present

## 2022-07-02 DIAGNOSIS — Z7689 Persons encountering health services in other specified circumstances: Secondary | ICD-10-CM | POA: Diagnosis not present

## 2022-07-02 DIAGNOSIS — Z789 Other specified health status: Secondary | ICD-10-CM | POA: Insufficient documentation

## 2022-07-02 DIAGNOSIS — Z720 Tobacco use: Secondary | ICD-10-CM

## 2022-07-02 LAB — URINE CYTOLOGY ANCILLARY ONLY
Chlamydia: NEGATIVE
Comment: NEGATIVE
Comment: NORMAL
Neisseria Gonorrhea: NEGATIVE

## 2022-07-02 MED ORDER — PHENTERMINE HCL 8 MG PO TABS: 8.0000 mg | ORAL_TABLET | Freq: Every day | ORAL | 2 refills | Status: DC

## 2022-07-02 MED ORDER — ESTRADIOL 2 MG PO TABS: ORAL_TABLET | ORAL | 1 refills | Status: DC

## 2022-07-02 MED ORDER — METRONIDAZOLE 0.75 % EX GEL: 1.0000 | Freq: Two times a day (BID) | CUTANEOUS | 3 refills | Status: DC

## 2022-07-02 NOTE — Progress Notes (Addendum)
Established Patient Office Visit  Subjective   Patient ID: Holly Walker, transgender female   DOB: [birth year] Age: 52 y.o. MRN: [id number]   Chief Complaint  Patient presents with   Follow-up    6 month follow up    Weight Loss   Medication Refill   HPI Pleasant 52 year old transgender female presenting today for the following:  Hormone replacement therapy: Taking estradiol 4 mg twice daily and spironolactone 100 mg daily.  Tolerating both medications well without side effects.  Remains happy with the effects of the medications.  Hair loss: Taking finasteride 5 mg daily and using topical minoxidil 5% foam twice daily.  Notes that the medication has done well and is helping with hair regrowth.  Is interested in increasing the dose of finasteride if possible to boost her results.  Rosacea: Has a history of having a bilateral cheek redness and has been told in the past that this is rosacea.  Is not using any special medications on it and has not tried any prescription options.  Read that retinol may help but he is open to treatment options.  Hypertension: Taking amlodipine 5 mg daily, tolerating well without side effects.  Denies CP, SOB, palpitations, lower extremity edema, dizziness, headaches, or vision changes.  Weight concerns: Has been having tremendous difficulty with losing the extra weight and is interested in options that may help.  Understands that exercise and dietary modifications are a must but feels like she needs something to help her along for now.  Heard about Latanya Presser from a friend of hers and is interested in giving this a try.   Objective:    Vitals:   07/02/22 1504 07/02/22 1505  BP: 128/86   Pulse: 97 97  Resp: 18   Height: '5\' 8"'$  (1.727 m)   Weight: 246 lb 1.9 oz (111.6 kg)   SpO2: 98% 98%  BMI (Calculated): 37.43     Physical Exam Vitals reviewed.  Constitutional:      General: She is not in acute distress.    Appearance: Normal appearance. She  is obese. She is not ill-appearing.  HENT:     Head: Normocephalic and atraumatic.  Cardiovascular:     Rate and Rhythm: Normal rate and regular rhythm.     Pulses: Normal pulses.     Heart sounds: Normal heart sounds.  Pulmonary:     Effort: Pulmonary effort is normal. No respiratory distress.     Breath sounds: Normal breath sounds. No wheezing, rhonchi or rales.  Skin:    General: Skin is warm and dry.  Neurological:     Mental Status: She is alert and oriented to person, place, and time.  Psychiatric:        Mood and Affect: Mood normal.        Behavior: Behavior normal.        Thought Content: Thought content normal.        Judgment: Judgment normal.   No results found for this or any previous visit (from the past 24 hour(s)).     The 10-year ASCVD risk score (Arnett DK, et al., 2019) is: 4.6%   Values used to calculate the score:     Age: 35 years     Sex: Female     Is Non-Hispanic African American: No     Diabetic: No     Tobacco smoker: Yes     Systolic Blood Pressure: 0000000 mmHg     Is BP treated: Yes  HDL Cholesterol: 46 mg/dL     Total Cholesterol: 166 mg/dL   Assessment & Plan:   1. Hypertension goal BP (blood pressure) < 130/80 Checking labs as below.  Blood pressure looks good today.  Continue amlodipine 5 mg daily.  Continue monitoring blood pressure at home with a goal of 130/80 or less.  Recommend regular intentional exercise, weight loss to healthy weight, and a low-sodium heart healthy diet. - CBC with Differential/Platelet - COMPLETE METABOLIC PANEL WITH GFR - Lipid panel  2. Encounter for weight management Discussed various options for weight management.  We will proceed with a low-dose of phentermine at 8 mg daily.  Advised to monitor blood pressure while on this medication as it is known for worsening hypertension issues.  Reviewed the importance of dietary modifications and regular intentional exercise when trying to lose weight. - Phentermine  HCl 8 MG TABS; Take 1 tablet (8 mg total) by mouth daily.  Dispense: 30 tablet; Refill: 2  3. Rosacea Start MetroGel twice daily to the affected area. - metroNIDAZOLE (METROGEL) 0.75 % gel; Apply 1 Application topically 2 (two) times daily.  Dispense: 45 g; Refill: 3  4. 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  Continue estradiol 4 mg twice daily and spironolactone 100 mg daily. - Testosterone - Estradiol - estradiol (ESTRACE) 2 MG tablet; TAKE 2 TABLETS(4 MG) BY MOUTH IN THE MORNING AND AT BEDTIME  Dispense: 360 tablet; Refill: 1  5. Encounter for screening mammogram for malignant neoplasm of breast Mammogram ordered. - MM DIGITAL SCREENING BILATERAL; Future  6. Tobacco use Discussed recommendations for tobacco cessation as well as monitoring for lung cancer. She has been smoking 1 pack of cigarettes per day for the last twenty year equaling a 20-pack-year smoking history. Agreeable with screening so CT chest ordered today. - CT CHEST LUNG CA SCREEN LOW DOSE W/O CM; Future  Return in about 3 months (around 09/30/2022) for weight check.  ___________________________________________ Clearnce Sorrel, DNP, APRN, FNP-BC Primary Care and [place]

## 2022-07-03 LAB — HIV-1 RNA QUANT-NO REFLEX-BLD
HIV 1 RNA Quant: NOT DETECTED Copies/mL
HIV-1 RNA Quant, Log: NOT DETECTED Log cps/mL

## 2022-07-03 LAB — RPR: RPR Ser Ql: NONREACTIVE

## 2022-07-06 LAB — B12 AND FOLATE PANEL
Folate: >24 ng/mL
Vitamin B-12: 1997 pg/mL — ABNORMAL HIGH (ref 200–1100)

## 2022-07-06 LAB — LIPID PANEL
Cholesterol: 188 mg/dL (ref <200)
HDL: 47 mg/dL (ref >=40)
LDL Cholesterol (Calc): 121 mg/dL (calc) — ABNORMAL HIGH
Non-HDL Cholesterol (Calc): 141 mg/dL (calc) — ABNORMAL HIGH (ref <130)
Total CHOL/HDL Ratio: 4 (calc) (ref <5.0)
Triglycerides: 92 mg/dL (ref <150)

## 2022-07-06 LAB — CBC WITH DIFFERENTIAL/PLATELET
Absolute Monocytes: 768 cells/uL (ref 200–950)
Basophils Absolute: 40 cells/uL (ref 0–200)
Basophils Relative: 0.4 %
Eosinophils Absolute: 141 cells/uL (ref 15–500)
Eosinophils Relative: 1.4 %
HCT: 49.1 % (ref 38.5–50.0)
Hemoglobin: 17.4 g/dL — ABNORMAL HIGH (ref 13.2–17.1)
Lymphs Abs: 2172 cells/uL (ref 850–3900)
MCH: 36.3 pg — ABNORMAL HIGH (ref 27.0–33.0)
MCHC: 35.4 g/dL (ref 32.0–36.0)
MCV: 102.3 fL — ABNORMAL HIGH (ref 80.0–100.0)
MPV: 9.7 fL (ref 7.5–12.5)
Monocytes Relative: 7.6 %
Neutro Abs: 6979 cells/uL (ref 1500–7800)
Neutrophils Relative %: 69.1 %
Platelets: 250 10*3/uL (ref 140–400)
RBC: 4.8 10*6/uL (ref 4.20–5.80)
RDW: 12.3 % (ref 11.0–15.0)
Total Lymphocyte: 21.5 %
WBC: 10.1 10*3/uL (ref 3.8–10.8)

## 2022-07-06 LAB — COMPLETE METABOLIC PANEL WITH GFR
AG Ratio: 1.6 (calc) (ref 1.0–2.5)
ALT: 21 U/L (ref 9–46)
AST: 20 U/L (ref 10–35)
Albumin: 4.6 g/dL (ref 3.6–5.1)
Alkaline phosphatase (APISO): 66 U/L (ref 35–144)
BUN: 17 mg/dL (ref 7–25)
CO2: 27 mmol/L (ref 20–32)
Calcium: 9.8 mg/dL (ref 8.6–10.3)
Chloride: 103 mmol/L (ref 98–110)
Creat: 1 mg/dL (ref 0.70–1.30)
Globulin: 2.8 g/dL (calc) (ref 1.9–3.7)
Glucose, Bld: 92 mg/dL (ref 65–99)
Potassium: 4.8 mmol/L (ref 3.5–5.3)
Sodium: 139 mmol/L (ref 135–146)
Total Bilirubin: 0.4 mg/dL (ref 0.2–1.2)
Total Protein: 7.4 g/dL (ref 6.1–8.1)
eGFR: 92 mL/min/{1.73_m2} (ref >=60)

## 2022-07-06 LAB — TESTOSTERONE, TOTAL, LC/MS/MS: Testosterone, Total, LC-MS-MS: 229 ng/dL — ABNORMAL HIGH (ref 2–45)

## 2022-07-06 LAB — ESTRADIOL: Estradiol: 84 pg/mL — ABNORMAL HIGH (ref <=39)

## 2022-07-06 MED ORDER — SPIRONOLACTONE 100 MG PO TABS: 100.0000 mg | ORAL_TABLET | Freq: Two times a day (BID) | ORAL | 3 refills | Status: DC

## 2022-07-06 NOTE — Addendum Note (Signed)
Addended bySamuel Bouche on: 07/06/2022 12:07 PM   Modules accepted: Orders

## 2022-07-09 ENCOUNTER — Telehealth: Payer: Self-pay

## 2022-07-09 NOTE — Telephone Encounter (Signed)
The provider will need to complete the peer to peer via phone call.

## 2022-07-09 NOTE — Telephone Encounter (Addendum)
CT lung cancer screening denied by insurance. Per insurance:  Patient must either be a current smoker or have quit smoking within the last 15 years. The patient must have a tobacco smoking history of at least 20 "pack years" (an average of one pack (20 cigarettes) per day for 20 years). Per protocol, the following information was not provided by your health professional.  Your provider can appeal your case at [phone], opt #4. Case/Ref # [id number].   Side note: Recent clinical notes were faxed to [facility] medical reviewer on 07/07/22.

## 2022-07-13 NOTE — Telephone Encounter (Signed)
Amended clinical notes faxed to [employer] at 854 am. Confirmation rec'd. Pending auth approval for CT scan.

## 2022-10-01 ENCOUNTER — Encounter: Payer: Self-pay | Admitting: Medical-Surgical

## 2022-10-01 ENCOUNTER — Ambulatory Visit (INDEPENDENT_AMBULATORY_CARE_PROVIDER_SITE_OTHER): Payer: Commercial Managed Care - HMO | Admitting: Medical-Surgical

## 2022-10-01 DIAGNOSIS — Z713 Dietary counseling and surveillance: Secondary | ICD-10-CM

## 2022-10-01 DIAGNOSIS — Z7689 Persons encountering health services in other specified circumstances: Secondary | ICD-10-CM

## 2022-10-01 MED ORDER — TOPIRAMATE 25 MG PO TABS: 25.0000 mg | ORAL_TABLET | Freq: Two times a day (BID) | ORAL | 1 refills | Status: DC

## 2022-10-01 MED ORDER — PHENTERMINE HCL 15 MG PO CAPS: 15.0000 mg | ORAL_CAPSULE | ORAL | 2 refills | Status: DC

## 2022-10-01 NOTE — Progress Notes (Signed)
        Established patient visit  History, exam, impression, and plan:  1. Encounter for weight management Pleasant 52 year old transgender female presenting today for follow-up on weight loss efforts.  Has been taking phentermine 8 mg daily for the last 2 months.  Tolerating the medication well without side effects.  Notes that she has lost a few pounds but is interested in possibly increasing the dose.  Has been increasing exercise and is now walking with her neighbor.  Working on dietary changes with an effort towards portion control and healthier options.  Her goal weight is less than 200 but admits that she would love to be in the 180s.  On exam, HRR, S1/S2 normal.  Lungs CTA, respirations even unlabored.  No peripheral edema.  Discussed the temporary nature of using phentermine for weight loss.  Since she has done very low-dose, feel comfortable with increasing to 15 mg daily for the next 3 months.  After further discussion, adding Topamax 25 mg daily for 1 week then increasing to 50 mg daily if well-tolerated.   Procedures performed this visit: None.  Return in about 3 months (around 01/01/2023) for weight check.  __________________________________ Thayer Ohm, DNP, APRN, FNP-BC Primary Care and Sports Medicine [facility] [place]

## 2022-10-18 ENCOUNTER — Telehealth: Payer: Self-pay | Admitting: General Practice

## 2022-10-18 NOTE — Transitions of Care (Post Inpatient/ED Visit) (Signed)
   10/18/2022  Name: Holly Walker MRN: [id number] DOB: 1970-12-24  Today's TOC FU Call Status: Today's TOC FU Call Status:: Unsuccessul Call (1st Attempt) Unsuccessful Call (1st Attempt) Date: 10/18/22  Attempted to reach the patient regarding the most recent Inpatient/ED visit.  Follow Up Plan: Additional outreach attempts will be made to reach the patient to complete the Transitions of Care (Post Inpatient/ED visit) call.   Signature [place]esto Charon, Control and instrumentation engineer

## 2022-10-19 NOTE — Transitions of Care (Post Inpatient/ED Visit) (Signed)
   10/19/2022  Name: Holly Walker MRN: [id number] DOB: 26-Nov-1970  Today's TOC FU Call Status: Today's TOC FU Call Status:: Unsuccessful Call (2nd Attempt) Unsuccessful Call (1st Attempt) Date: 10/18/22 Unsuccessful Call (2nd Attempt) Date: 10/19/22  Attempted to reach the patient regarding the most recent Inpatient/ED visit.  Follow Up Plan: Additional outreach attempts will be made to reach the patient to complete the Transitions of Care (Post Inpatient/ED visit) call.   Signature [place] Charon, Control and instrumentation engineer

## 2022-10-20 NOTE — Transitions of Care (Post Inpatient/ED Visit) (Signed)
   10/20/2022  Name: Holly Walker MRN: [id number] DOB: 07-27-1970  Today's TOC FU Call Status: Today's TOC FU Call Status:: Unsuccessful Call (3rd Attempt) Unsuccessful Call (1st Attempt) Date: 10/18/22 Unsuccessful Call (2nd Attempt) Date: 10/19/22 Unsuccessful Call (3rd Attempt) Date: 10/20/22  Attempted to reach the patient regarding the most recent Inpatient/ED visit.  Follow Up Plan: No further outreach attempts will be made at this time. We have been unable to contact the patient.  Signature [place] Charon, Control and instrumentation engineer

## 2022-10-21 ENCOUNTER — Ambulatory Visit (INDEPENDENT_AMBULATORY_CARE_PROVIDER_SITE_OTHER): Payer: Commercial Managed Care - HMO

## 2022-10-21 ENCOUNTER — Ambulatory Visit (INDEPENDENT_AMBULATORY_CARE_PROVIDER_SITE_OTHER): Payer: Commercial Managed Care - HMO | Admitting: Medical-Surgical

## 2022-10-21 ENCOUNTER — Encounter: Payer: Self-pay | Admitting: Medical-Surgical

## 2022-10-21 DIAGNOSIS — F17210 Nicotine dependence, cigarettes, uncomplicated: Secondary | ICD-10-CM | POA: Diagnosis not present

## 2022-10-21 DIAGNOSIS — M5412 Radiculopathy, cervical region: Secondary | ICD-10-CM | POA: Diagnosis not present

## 2022-10-21 DIAGNOSIS — Z1231 Encounter for screening mammogram for malignant neoplasm of breast: Secondary | ICD-10-CM

## 2022-10-21 DIAGNOSIS — Z720 Tobacco use: Secondary | ICD-10-CM

## 2022-10-21 MED ORDER — PREDNISONE 50 MG PO TABS: 50.0000 mg | ORAL_TABLET | Freq: Every day | ORAL | 0 refills | Status: DC

## 2022-10-21 MED ORDER — TIZANIDINE HCL 4 MG PO TABS: 4.0000 mg | ORAL_TABLET | Freq: Four times a day (QID) | ORAL | 0 refills | Status: DC | PRN

## 2022-10-21 NOTE — Progress Notes (Signed)
The 10-year ASCVD risk score (Arnett DK, et al., 2019) is: 6.7%   Values used to calculate the score:     Age: 52 years     Sex: Female     Is Non-Hispanic African American: No     Diabetic: No     Tobacco smoker: Yes     Systolic Blood Pressure: 144 mmHg     Is BP treated: Yes     HDL Cholesterol: 47 mg/dL     Total Cholesterol: 188 mg/dL  Sandie Ano, RN
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
## 2022-10-27 ENCOUNTER — Encounter: Payer: Self-pay | Admitting: Medical-Surgical

## 2022-10-27 DIAGNOSIS — J439 Emphysema, unspecified: Secondary | ICD-10-CM | POA: Insufficient documentation

## 2022-10-27 DIAGNOSIS — I700 Atherosclerosis of aorta: Secondary | ICD-10-CM | POA: Insufficient documentation

## 2023-01-05 ENCOUNTER — Other Ambulatory Visit: Payer: Self-pay

## 2023-01-05 ENCOUNTER — Ambulatory Visit: Payer: Commercial Managed Care - HMO | Admitting: Internal Medicine

## 2023-01-05 ENCOUNTER — Encounter: Payer: Self-pay | Admitting: Internal Medicine

## 2023-01-05 VITALS — BP 134/86 | HR 96 | Temp 98.3°F | Ht 68.0 in | Wt 239.0 lb

## 2023-01-05 DIAGNOSIS — F17210 Nicotine dependence, cigarettes, uncomplicated: Secondary | ICD-10-CM

## 2023-01-05 DIAGNOSIS — B20 Human immunodeficiency virus [HIV] disease: Secondary | ICD-10-CM

## 2023-01-05 DIAGNOSIS — Z720 Tobacco use: Secondary | ICD-10-CM

## 2023-01-05 DIAGNOSIS — Z21 Asymptomatic human immunodeficiency virus [HIV] infection status: Secondary | ICD-10-CM

## 2023-01-05 DIAGNOSIS — Z113 Encounter for screening for infections with a predominantly sexual mode of transmission: Secondary | ICD-10-CM

## 2023-01-05 NOTE — Assessment & Plan Note (Signed)
She contineus to do well with Biktarvy, no changes indicated.  Will check her labs otherwise and she can rtc in 6 months.

## 2023-01-05 NOTE — Assessment & Plan Note (Signed)
Discussed cessation 
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
## 2023-01-05 NOTE — Assessment & Plan Note (Signed)
Will screen and include anal pap

## 2023-01-07 ENCOUNTER — Encounter: Payer: Self-pay | Admitting: Medical-Surgical

## 2023-01-07 ENCOUNTER — Ambulatory Visit: Payer: Commercial Managed Care - HMO | Admitting: Medical-Surgical

## 2023-01-07 ENCOUNTER — Other Ambulatory Visit (HOSPITAL_COMMUNITY)
Admission: RE | Admit: 2023-01-07 | Discharge: 2023-01-07 | Disposition: A | Discharge status: Home / Self Care | Payer: Commercial Managed Care - HMO | Source: Ambulatory Visit | Attending: Internal Medicine | Admitting: Internal Medicine

## 2023-01-07 VITALS — BP 142/85 | HR 103 | Resp 20 | Ht 68.0 in | Wt 239.0 lb

## 2023-01-07 DIAGNOSIS — Z720 Tobacco use: Secondary | ICD-10-CM

## 2023-01-07 DIAGNOSIS — Z113 Encounter for screening for infections with a predominantly sexual mode of transmission: Secondary | ICD-10-CM | POA: Insufficient documentation

## 2023-01-07 DIAGNOSIS — J439 Emphysema, unspecified: Secondary | ICD-10-CM | POA: Diagnosis not present

## 2023-01-07 DIAGNOSIS — Z7689 Persons encountering health services in other specified circumstances: Secondary | ICD-10-CM | POA: Diagnosis not present

## 2023-01-07 DIAGNOSIS — Z789 Other specified health status: Secondary | ICD-10-CM

## 2023-01-07 DIAGNOSIS — I700 Atherosclerosis of aorta: Secondary | ICD-10-CM

## 2023-01-07 LAB — CT/NG RNA, TMA RECTAL
Chlamydia Trachomatis RNA: NOT DETECTED
Neisseria Gonorrhoeae RNA: NOT DETECTED

## 2023-01-07 LAB — GC/CHLAMYDIA PROBE, AMP (THROAT)
Chlamydia trachomatis RNA: NOT DETECTED
Neisseria gonorrhoeae RNA: NOT DETECTED

## 2023-01-07 LAB — HIV-1 RNA QUANT-NO REFLEX-BLD
HIV 1 RNA Quant: NOT DETECTED {copies}/mL
HIV-1 RNA Quant, Log: NOT DETECTED {Log_copies}/mL

## 2023-01-07 LAB — T-HELPER CELLS (CD4) COUNT (NOT AT ARMC)
Absolute CD4: 562 {cells}/uL (ref 490–1740)
CD4 T Helper %: 21 % — ABNORMAL LOW (ref 30–61)
Total lymphocyte count: 2638 {cells}/uL (ref 850–3900)

## 2023-01-07 LAB — C. TRACHOMATIS/N. GONORRHOEAE RNA
C. trachomatis RNA, TMA: NOT DETECTED
N. gonorrhoeae RNA, TMA: NOT DETECTED

## 2023-01-07 MED ORDER — VARENICLINE TARTRATE (STARTER) 0.5 MG X 11 & 1 MG X 42 PO TBPK: ORAL_TABLET | ORAL | 0 refills | Status: DC

## 2023-01-07 MED ORDER — PHENTERMINE HCL 15 MG PO CAPS: 15.0000 mg | ORAL_CAPSULE | ORAL | 2 refills | Status: DC

## 2023-01-07 MED ORDER — TOPIRAMATE 25 MG PO TABS: 25.0000 mg | ORAL_TABLET | Freq: Two times a day (BID) | ORAL | 1 refills | Status: DC
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
## 2023-01-07 NOTE — Addendum Note (Signed)
Addended by: Harley Alto on: 01/07/2023 11:39 AM   Modules accepted: Orders

## 2023-01-08 ENCOUNTER — Encounter: Payer: Self-pay | Admitting: Medical-Surgical

## 2023-01-08 LAB — CBC WITH DIFFERENTIAL/PLATELET
Basophils Absolute: 0.1 10*3/uL (ref 0.0–0.2)
Basos: 1 %
EOS (ABSOLUTE): 0.2 10*3/uL (ref 0.0–0.4)
Eos: 2 %
Hematocrit: 48.5 % — ABNORMAL HIGH (ref 34.0–46.6)
Hemoglobin: 16.8 g/dL — ABNORMAL HIGH (ref 11.1–15.9)
Immature Grans (Abs): 0 10*3/uL (ref 0.0–0.1)
Immature Granulocytes: 0 %
Lymphocytes Absolute: 2.3 10*3/uL (ref 0.7–3.1)
Lymphs: 23 %
MCH: 35.7 pg — ABNORMAL HIGH (ref 26.6–33.0)
MCHC: 34.6 g/dL (ref 31.5–35.7)
MCV: 103 fL — ABNORMAL HIGH (ref 79–97)
Monocytes Absolute: 0.9 10*3/uL (ref 0.1–0.9)
Monocytes: 9 %
Neutrophils Absolute: 6.5 10*3/uL (ref 1.4–7.0)
Neutrophils: 65 %
Platelets: 244 10*3/uL (ref 150–450)
RBC: 4.71 x10E6/uL (ref 3.77–5.28)
RDW: 12.5 % (ref 11.7–15.4)
WBC: 9.9 10*3/uL (ref 3.4–10.8)

## 2023-01-08 LAB — LIPID PANEL
Chol/HDL Ratio: 4.5 ratio — ABNORMAL HIGH (ref 0.0–4.4)
Cholesterol, Total: 168 mg/dL (ref 100–199)
HDL: 37 mg/dL — ABNORMAL LOW (ref >39)
LDL Chol Calc (NIH): 96 mg/dL (ref 0–99)
Triglycerides: 206 mg/dL — ABNORMAL HIGH (ref 0–149)
VLDL Cholesterol Cal: 35 mg/dL (ref 5–40)

## 2023-01-08 LAB — TESTOSTERONE: Testosterone: 228 ng/dL — ABNORMAL HIGH (ref 4–50)

## 2023-01-08 LAB — ESTRADIOL: Estradiol: 80 pg/mL

## 2023-01-08 LAB — CMP14+EGFR
ALT: 16 IU/L (ref 0–32)
AST: 16 IU/L (ref 0–40)
Albumin: 4.2 g/dL (ref 3.9–4.9)
Alkaline Phosphatase: 80 IU/L (ref 44–121)
BUN/Creatinine Ratio: 15 (ref 9–23)
BUN: 17 mg/dL (ref 6–24)
Bilirubin Total: 0.3 mg/dL (ref 0.0–1.2)
CO2: 21 mmol/L (ref 20–29)
Calcium: 9.3 mg/dL (ref 8.7–10.2)
Chloride: 104 mmol/L (ref 96–106)
Creatinine, Ser: 1.1 mg/dL — ABNORMAL HIGH (ref 0.57–1.00)
Globulin, Total: 2.4 g/dL (ref 1.5–4.5)
Glucose: 103 mg/dL — ABNORMAL HIGH (ref 70–99)
Potassium: 4.4 mmol/L (ref 3.5–5.2)
Sodium: 141 mmol/L (ref 134–144)
Total Protein: 6.6 g/dL (ref 6.0–8.5)
eGFR: 61 mL/min/{1.73_m2} (ref >59)

## 2023-01-11 ENCOUNTER — Encounter: Payer: Self-pay | Admitting: Medical-Surgical

## 2023-01-13 ENCOUNTER — Other Ambulatory Visit: Payer: Self-pay | Admitting: Internal Medicine

## 2023-01-13 ENCOUNTER — Telehealth: Payer: Self-pay

## 2023-01-13 DIAGNOSIS — R85610 Atypical squamous cells of undetermined significance on cytologic smear of anus (ASC-US): Secondary | ICD-10-CM | POA: Insufficient documentation

## 2023-01-13 LAB — CYTOLOGY - PAP
Comment: NEGATIVE
Diagnosis: UNDETERMINED — AB
High risk HPV: POSITIVE — AB

## 2023-01-13 NOTE — Telephone Encounter (Signed)
-----   Message from Belia Heman Comer sent at 01/13/2023 10:30 AM EDT ----- Please let her know that the anal PAP screening was positive and will refer her to surgery for further evaluation.  thanks

## 2023-01-13 NOTE — Telephone Encounter (Signed)
Spoke with Iysha and discussed anal pap results and referral to [facility]. Will send MyChart with additional information explaining results.   Sandie Ano, RN

## 2023-01-17 ENCOUNTER — Ambulatory Visit: Payer: Commercial Managed Care - HMO | Admitting: Sports Medicine

## 2023-01-17 ENCOUNTER — Encounter: Payer: Self-pay | Admitting: Sports Medicine

## 2023-01-17 DIAGNOSIS — M5136 Other intervertebral disc degeneration, lumbar region: Secondary | ICD-10-CM

## 2023-01-17 DIAGNOSIS — M51369 Other intervertebral disc degeneration, lumbar region without mention of lumbar back pain or lower extremity pain: Secondary | ICD-10-CM | POA: Insufficient documentation

## 2023-01-17 MED ORDER — HYDROCODONE-ACETAMINOPHEN 10-325 MG PO TABS: 1.0000 | ORAL_TABLET | Freq: Three times a day (TID) | ORAL | 0 refills | Status: DC | PRN
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
## 2023-01-18 ENCOUNTER — Ambulatory Visit (INDEPENDENT_AMBULATORY_CARE_PROVIDER_SITE_OTHER): Payer: Self-pay

## 2023-01-18 ENCOUNTER — Encounter: Payer: Self-pay | Admitting: Sports Medicine

## 2023-01-18 DIAGNOSIS — I700 Atherosclerosis of aorta: Secondary | ICD-10-CM

## 2023-01-19 ENCOUNTER — Telehealth: Payer: Self-pay | Admitting: Medical-Surgical

## 2023-01-19 DIAGNOSIS — I10 Essential (primary) hypertension: Secondary | ICD-10-CM

## 2023-01-19 DIAGNOSIS — L659 Nonscarring hair loss, unspecified: Secondary | ICD-10-CM

## 2023-01-19 MED ORDER — AMLODIPINE BESYLATE 5 MG PO TABS
ORAL_TABLET | ORAL | 0 refills | Status: DC
Start: 2023-01-19 — End: 2023-01-24

## 2023-01-19 MED ORDER — FINASTERIDE 5 MG PO TABS
5.0000 mg | ORAL_TABLET | Freq: Every day | ORAL | 0 refills | Status: DC
Start: 2023-01-19 — End: 2023-01-24

## 2023-01-19 NOTE — Telephone Encounter (Signed)
Patient called requesting a refill of finasteride (PROSCAR) 5 MG tablet [[id number]] and amLODipine (NORVASC) 5 MG tablet [[id number]]  Pharmacy ;   [id number] - [place], [place] - [address] AT [facility] [address]

## 2023-01-19 NOTE — Telephone Encounter (Signed)
Sent in a 90 day supply to the requested pharmacy. No refills patient has a follow up appointment in December.

## 2023-01-24 ENCOUNTER — Encounter: Payer: Self-pay | Admitting: Medical-Surgical

## 2023-01-24 DIAGNOSIS — I10 Essential (primary) hypertension: Secondary | ICD-10-CM

## 2023-01-24 DIAGNOSIS — I700 Atherosclerosis of aorta: Secondary | ICD-10-CM

## 2023-01-24 DIAGNOSIS — L659 Nonscarring hair loss, unspecified: Secondary | ICD-10-CM

## 2023-01-24 MED ORDER — FINASTERIDE 5 MG PO TABS
5.0000 mg | ORAL_TABLET | Freq: Every day | ORAL | 1 refills | Status: DC
Start: 2023-01-24 — End: 2023-10-10

## 2023-01-24 MED ORDER — AMLODIPINE BESYLATE 5 MG PO TABS: ORAL_TABLET | ORAL | 1 refills | Status: DC

## 2023-01-24 MED ORDER — ATORVASTATIN CALCIUM 10 MG PO TABS: 10.0000 mg | ORAL_TABLET | Freq: Every day | ORAL | 3 refills | Status: DC

## 2023-02-01 ENCOUNTER — Encounter: Payer: Self-pay | Admitting: Sports Medicine

## 2023-02-02 ENCOUNTER — Encounter: Payer: Self-pay | Admitting: Physical Therapy

## 2023-02-02 ENCOUNTER — Other Ambulatory Visit: Payer: Self-pay

## 2023-02-02 ENCOUNTER — Ambulatory Visit: Payer: Commercial Managed Care - HMO | Attending: Sports Medicine | Admitting: Physical Therapy

## 2023-02-02 DIAGNOSIS — R252 Cramp and spasm: Secondary | ICD-10-CM | POA: Insufficient documentation

## 2023-02-02 DIAGNOSIS — M5442 Lumbago with sciatica, left side: Secondary | ICD-10-CM | POA: Insufficient documentation

## 2023-02-02 DIAGNOSIS — M5136 Other intervertebral disc degeneration, lumbar region: Secondary | ICD-10-CM | POA: Insufficient documentation

## 2023-02-02 DIAGNOSIS — M6281 Muscle weakness (generalized): Secondary | ICD-10-CM | POA: Insufficient documentation

## 2023-02-02 DIAGNOSIS — G8929 Other chronic pain: Secondary | ICD-10-CM | POA: Diagnosis present

## 2023-02-02 NOTE — Telephone Encounter (Signed)
Please contact [place] [facility] and the insurance company to determine reasons for denial and who to contact to dispute.
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
## 2023-02-15 ENCOUNTER — Ambulatory Visit: Payer: Self-pay | Admitting: General Surgery

## 2023-02-15 NOTE — H&P (Signed)
REFERRING PHYSICIAN:  Gardiner Barefoot, MD  PROVIDER:  Elenora Gamma, MD  MRN: [id number] DOB: [birth year] DATE OF ENCOUNTER: 02/15/2023  Subjective  Chief Complaint: ASCUS     History of Present Illness: Holly Walker is a 52 y.o. adult who is seen today as an office consultation at the request of Dr. Luciana Axe for evaluation of ASCUS .  52 year old transgender female with HIV and ASCUS noted on recent anal Pap test.   Review of Systems: A complete review of systems was obtained from the patient.  I have reviewed this information and discussed as appropriate with the patient.  See HPI as well for other ROS.    Medical History: Past Medical History: Diagnosis Date  Hypertension    Patient Active Problem List Diagnosis  Asymptomatic HIV infection (CMS/HHS-HCC)  Pap smear of anus with ASCUS  Routine screening for STI (sexually transmitted infection)  Marijuana smoker, episodic   Past Surgical History: Procedure Laterality Date  CHOLECYSTECTOMY  1991    No Known Allergies  Current Outpatient Medications on File Prior to Visit Medication Sig Dispense Refill  amLODIPine (NORVASC) 5 MG tablet Take 5 mg by mouth once daily    atorvastatin (LIPITOR) 10 MG tablet 1 tablet Orally Once a day    estradioL (ESTRACE) 2 MG tablet TAKE 2 TABLETS(4 MG) BY MOUTH IN THE MORNING AND AT BEDTIME    finasteride (PROSCAR) 5 mg tablet Take 5 mg by mouth once daily    phentermine (ADIPEX-P) 15 MG capsule Take 15 mg by mouth every morning    spironolactone (ALDACTONE) 100 MG tablet Take 100 mg by mouth 2 (two) times daily    topiramate (TOPAMAX) 25 MG tablet Take 25 mg by mouth 2 (two) times daily    BIKTARVY 50-200-25 mg tablet Take 1 tablet by mouth once daily    varenicline (CHANTIX STARTING MONTH PAK) tablet TAKE AS DIRECTED PER PACKAGE    No current facility-administered medications on file prior to visit.   Family History Problem Relation Age of  Onset  Obesity Mother   High blood pressure (Hypertension) Mother   High blood pressure (Hypertension) Brother     Social History  Tobacco Use Smoking Status Every Day  Types: Cigarettes Smokeless Tobacco Never    Social History  Socioeconomic History  Marital status: Life Partner Tobacco Use  Smoking status: Every Day   Types: Cigarettes  Smokeless tobacco: Never Substance and Sexual Activity  Alcohol use: Yes   Comment: On occasion  Drug use: Defer  Social Determinants of Health  Financial Resource Strain: Medium Risk (01/04/2023)  Received from [facility]  Overall Financial Resource Strain (CARDIA)   Difficulty of Paying Living Expenses: Somewhat hard Food Insecurity: Patient Declined (01/04/2023)  Received from [facility]  Hunger Vital Sign   Worried About Running Out of Food in the Last Year: Patient declined   Ran Out of Food in the Last Year: Patient declined Transportation Needs: Patient Declined (01/04/2023)  Received from [facility] - Transportation   Lack of Transportation (Medical): Patient declined   Lack of Transportation (Non-Medical): Patient declined Physical Activity: Sufficiently Active (01/04/2023)  Received from [facility]  Exercise Vital Sign   Days of Exercise per Week: 2 days   Minutes of Exercise per Session: 150+ min Stress: Stress Concern Present (01/04/2023)  Received from [facility] - Occupational Stress Questionnaire   Feeling of Stress : Rather much Social Connections: Unknown (01/04/2023)  Received from [facility]  Social Connection and Isolation Panel [NHANES]   Frequency of Communication with Friends and Family: More than three times a week   Frequency of Social Gatherings with Friends and Family: Once a week   Attends Religious Services: Patient declined   Marital Status: Separated   Objective:   Vitals:  02/15/23 0853 BP: 130/80 Pulse: 103 Temp: 36.7 C (98  F) SpO2: 99% Weight: (!) 107.8 kg (237 lb 9.6 oz) Height: 172.7 cm (5\' 8" ) PainSc:   2    Exam Gen: NAD Abd: soft    Labs, Imaging and Diagnostic Testing: CD4 562, HIV ND  Assessment and Plan: Diagnoses and all orders for this visit:  Pap smear of anus with ASCUS     52 year old transgender female who presents to the office for evaluation of an anal Pap test that showed ASCUS.  We discussed performing high-resolution anoscopy with possible biopsy or ablation.  We discussed postoperative discomfort and bleeding that can occur after the procedure.  We discussed the need for ongoing follow-up if biopsy shows a dysplasia.  I believe the patient understands this and is willing to proceed.  Vanita Panda, MD Colon and Rectal Surgery [facility]

## 2023-02-16 ENCOUNTER — Other Ambulatory Visit: Payer: Self-pay | Admitting: Medical-Surgical

## 2023-02-16 MED ORDER — VARENICLINE TARTRATE 1 MG PO TABS: 1.0000 mg | ORAL_TABLET | Freq: Two times a day (BID) | ORAL | 3 refills | Status: DC

## 2023-02-23 ENCOUNTER — Ambulatory Visit: Payer: Commercial Managed Care - HMO | Attending: Sports Medicine

## 2023-02-23 DIAGNOSIS — R252 Cramp and spasm: Secondary | ICD-10-CM | POA: Insufficient documentation

## 2023-02-23 DIAGNOSIS — G8929 Other chronic pain: Secondary | ICD-10-CM | POA: Insufficient documentation

## 2023-02-23 DIAGNOSIS — M6281 Muscle weakness (generalized): Secondary | ICD-10-CM | POA: Insufficient documentation

## 2023-02-23 DIAGNOSIS — M5442 Lumbago with sciatica, left side: Secondary | ICD-10-CM | POA: Diagnosis present
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
## 2023-02-28 ENCOUNTER — Ambulatory Visit: Payer: Commercial Managed Care - HMO | Admitting: Sports Medicine

## 2023-02-28 ENCOUNTER — Encounter: Payer: Self-pay | Admitting: Sports Medicine

## 2023-02-28 DIAGNOSIS — M5136 Other intervertebral disc degeneration, lumbar region with discogenic back pain only: Secondary | ICD-10-CM
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
## 2023-03-02 ENCOUNTER — Ambulatory Visit: Payer: Commercial Managed Care - HMO

## 2023-03-02 DIAGNOSIS — R252 Cramp and spasm: Secondary | ICD-10-CM

## 2023-03-02 DIAGNOSIS — G8929 Other chronic pain: Secondary | ICD-10-CM

## 2023-03-02 DIAGNOSIS — M5442 Lumbago with sciatica, left side: Secondary | ICD-10-CM | POA: Diagnosis not present

## 2023-03-02 DIAGNOSIS — M6281 Muscle weakness (generalized): Secondary | ICD-10-CM
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
## 2023-03-07 ENCOUNTER — Ambulatory Visit: Payer: Commercial Managed Care - HMO

## 2023-03-07 DIAGNOSIS — M6281 Muscle weakness (generalized): Secondary | ICD-10-CM

## 2023-03-07 DIAGNOSIS — G8929 Other chronic pain: Secondary | ICD-10-CM

## 2023-03-07 DIAGNOSIS — R252 Cramp and spasm: Secondary | ICD-10-CM

## 2023-03-07 DIAGNOSIS — M5442 Lumbago with sciatica, left side: Secondary | ICD-10-CM | POA: Diagnosis not present
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
## 2023-03-09 ENCOUNTER — Ambulatory Visit: Payer: Commercial Managed Care - HMO

## 2023-03-09 DIAGNOSIS — R252 Cramp and spasm: Secondary | ICD-10-CM

## 2023-03-09 DIAGNOSIS — M5442 Lumbago with sciatica, left side: Secondary | ICD-10-CM | POA: Diagnosis not present

## 2023-03-09 DIAGNOSIS — G8929 Other chronic pain: Secondary | ICD-10-CM

## 2023-03-09 DIAGNOSIS — M6281 Muscle weakness (generalized): Secondary | ICD-10-CM
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
## 2023-03-14 ENCOUNTER — Ambulatory Visit: Payer: Commercial Managed Care - HMO | Admitting: Physical Therapy

## 2023-03-16 ENCOUNTER — Ambulatory Visit: Payer: Commercial Managed Care - HMO | Attending: Sports Medicine | Admitting: Physical Therapy

## 2023-03-16 ENCOUNTER — Encounter: Payer: Self-pay | Admitting: Physical Therapy

## 2023-03-16 DIAGNOSIS — M6281 Muscle weakness (generalized): Secondary | ICD-10-CM | POA: Insufficient documentation

## 2023-03-16 DIAGNOSIS — G8929 Other chronic pain: Secondary | ICD-10-CM | POA: Insufficient documentation

## 2023-03-16 DIAGNOSIS — R252 Cramp and spasm: Secondary | ICD-10-CM | POA: Diagnosis present

## 2023-03-16 DIAGNOSIS — M5442 Lumbago with sciatica, left side: Secondary | ICD-10-CM | POA: Insufficient documentation
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
## 2023-03-24 ENCOUNTER — Other Ambulatory Visit: Payer: Self-pay

## 2023-03-24 MED ORDER — TOPIRAMATE 25 MG PO TABS: 25.0000 mg | ORAL_TABLET | Freq: Two times a day (BID) | ORAL | 1 refills | Status: DC

## 2023-03-24 MED ORDER — VALACYCLOVIR HCL 500 MG PO TABS: 500.0000 mg | ORAL_TABLET | Freq: Two times a day (BID) | ORAL | 0 refills | Status: DC

## 2023-03-30 ENCOUNTER — Ambulatory Visit: Payer: Commercial Managed Care - HMO

## 2023-03-30 DIAGNOSIS — G8929 Other chronic pain: Secondary | ICD-10-CM

## 2023-03-30 DIAGNOSIS — R252 Cramp and spasm: Secondary | ICD-10-CM

## 2023-03-30 DIAGNOSIS — M5442 Lumbago with sciatica, left side: Secondary | ICD-10-CM | POA: Diagnosis not present

## 2023-03-30 DIAGNOSIS — M6281 Muscle weakness (generalized): Secondary | ICD-10-CM
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
## 2023-04-11 ENCOUNTER — Encounter: Payer: Self-pay | Admitting: Medical-Surgical

## 2023-04-11 ENCOUNTER — Ambulatory Visit: Payer: Commercial Managed Care - HMO | Admitting: Medical-Surgical

## 2023-04-11 VITALS — BP 131/79 | HR 88 | Resp 20 | Ht 68.0 in | Wt 239.1 lb

## 2023-04-11 DIAGNOSIS — Z7689 Persons encountering health services in other specified circumstances: Secondary | ICD-10-CM

## 2023-04-11 DIAGNOSIS — I10 Essential (primary) hypertension: Secondary | ICD-10-CM | POA: Diagnosis not present

## 2023-04-11 MED ORDER — PHENTERMINE HCL 15 MG PO CAPS
15.0000 mg | ORAL_CAPSULE | ORAL | 2 refills | Status: DC
Start: 2023-04-11 — End: 2024-01-13

## 2023-04-11 MED ORDER — TOPIRAMATE 25 MG PO TABS: 25.0000 mg | ORAL_TABLET | Freq: Two times a day (BID) | ORAL | 1 refills | Status: DC

## 2023-04-11 MED ORDER — TRETINOIN 0.1 % EX CREA: TOPICAL_CREAM | Freq: Every day | CUTANEOUS | 3 refills | Status: AC

## 2023-04-11 NOTE — Progress Notes (Signed)
        Established patient visit  History, exam, impression, and plan:  1. Hypertension goal BP (blood pressure) < 130/80 Pleasant 52 year old female presenting today with a hx of HTN currently managed with Amlodipine 5mg  daily and Spronolactone 100mg  twice daily. Tolerating well without side effects. Not regularly checking at home. Denies concerning symptoms. Has been well controlled even with the addition of low dose phentermine. Continue current medications as BP stable.   2. Encounter for weight management Has been taking phentermine 15mg  daily x 3 months. Also on Topamax 25mg  twice daily. Feels like the medications are helpful and has noted that her appetite is reduced. Did not gain weight over Thanksgiving which is reassuring. Has been working on portion control, healthy choices, and getting intentional exercise. No concerning symptoms at this time. Aware of the limited nature of the use of phentermine. Agreeable to do another 3 months of phentermine but can continue Topamax 25mg  BID long term if desired.  - phentermine 15 MG capsule; Take 1 capsule (15 mg total) by mouth every morning.  Dispense: 30 capsule; Refill: 2 - topiramate (TOPAMAX) 25 MG tablet; Take 1 tablet (25 mg total) by mouth 2 (two) times daily.  Dispense: 60 tablet; Refill: 1  Physical Exam Vitals reviewed.  Constitutional:      General: She is not in acute distress.    Appearance: Normal appearance.  HENT:     Head: Normocephalic and atraumatic.  Cardiovascular:     Rate and Rhythm: Normal rate and regular rhythm.     Pulses: Normal pulses.     Heart sounds: Normal heart sounds. No murmur heard.    No friction rub. No gallop.  Pulmonary:     Effort: Pulmonary effort is normal. No respiratory distress.     Breath sounds: Normal breath sounds. No wheezing.  Skin:    General: Skin is warm and dry.  Neurological:     Mental Status: She is alert and oriented to person, place, and time.  Psychiatric:        Mood  and Affect: Mood normal.        Behavior: Behavior normal.        Thought Content: Thought content normal.        Judgment: Judgment normal.     Procedures performed this visit: None.  Return in about 3 months (around 07/10/2023) for chronic disease follow up.  __________________________________ Thayer Ohm, DNP, APRN, FNP-BC Primary Care and Sports Medicine [facility] [place]

## 2023-04-18 ENCOUNTER — Other Ambulatory Visit: Payer: Self-pay

## 2023-04-18 DIAGNOSIS — Z789 Other specified health status: Secondary | ICD-10-CM

## 2023-04-18 MED ORDER — ESTRADIOL 2 MG PO TABS: ORAL_TABLET | ORAL | 1 refills | Status: DC

## 2023-04-25 ENCOUNTER — Encounter (HOSPITAL_BASED_OUTPATIENT_CLINIC_OR_DEPARTMENT_OTHER): Payer: Self-pay | Admitting: General Surgery

## 2023-04-25 NOTE — Progress Notes (Signed)
Spoke w/ via phone for pre-op interview--- [employer]----    EKG and ISTAT per anesthesia.     Lab results------ COVID test -----patient states asymptomatic no test needed Arrive at -------0530 NPO after MN NO Solid Food.   Med rec completed Medications to take morning of surgery ----- Norvasc, Biktarvy, Proscar, Estrace, Topamax and valtrex. Diabetic medication ----- Patient instructed no nail polish to be worn day of surgery Patient instructed to bring photo id and insurance card day of surgery Patient aware to have Driver (ride ) / caregiver    for 24 hours after surgery - Carroll Kinds Patient Special Instructions ----- Hold Phentermine at least 24hrs prior to surgery. Pre-Op special Instructions ----- Patient verbalized understanding of instructions that were given at this phone interview. Patient denies chest pain, sob, fever, cough at the interview.

## 2023-04-28 NOTE — Anesthesia Preprocedure Evaluation (Addendum)
Anesthesia Evaluation  Patient identified by MRN, date of birth, ID band Patient awake    Reviewed: Allergy & Precautions, NPO status , Patient's Chart, lab work & pertinent test results  Airway Mallampati: III  TM Distance: >3 FB Neck ROM: Full    Dental  (+) Teeth Intact, Dental Advisory Given   Pulmonary COPD, Current Smoker and Patient abstained from smoking. 20 pack year history, 1/2ppd, no inhalers   Pulmonary exam normal breath sounds clear to auscultation       Cardiovascular hypertension (131/73 preop), Pt. on medications Normal cardiovascular exam Rhythm:Regular Rate:Normal     Neuro/Psych  PSYCHIATRIC DISORDERS Anxiety  Bipolar Disorder   negative neurological ROS     GI/Hepatic negative GI ROS,,,(+)     substance abuse (daily marijuana)  marijuana use  Endo/Other  BMI 36  Renal/GU negative Renal ROS  negative genitourinary   Musculoskeletal  (+) Arthritis , Osteoarthritis,    Abdominal  (+) + obese  Peds  Hematology negative hematology ROS (+)   Anesthesia Other Findings   Reproductive/Obstetrics negative OB ROS                             Anesthesia Physical Anesthesia Plan  ASA: 2  Anesthesia Plan: General   Post-op Pain Management: Tylenol PO (pre-op)*   Induction: Intravenous  PONV Risk Score and Plan: 2 and Ondansetron, Dexamethasone, Midazolam and Treatment may vary due to age or medical condition  Airway Management Planned: LMA  Additional Equipment: None  Intra-op Plan:   Post-operative Plan: Extubation in OR  Informed Consent: I have reviewed the patients History and Physical, chart, labs and discussed the procedure including the risks, benefits and alternatives for the proposed anesthesia with the patient or authorized representative who has indicated his/her understanding and acceptance.     Dental advisory given  Plan Discussed with:  CRNA  Anesthesia Plan Comments:        Anesthesia Quick Evaluation

## 2023-04-29 ENCOUNTER — Encounter (HOSPITAL_BASED_OUTPATIENT_CLINIC_OR_DEPARTMENT_OTHER): Payer: Self-pay | Admitting: General Surgery

## 2023-04-29 ENCOUNTER — Encounter (HOSPITAL_BASED_OUTPATIENT_CLINIC_OR_DEPARTMENT_OTHER)
Admission: RE | Disposition: A | Discharge status: Home / Self Care | Payer: Self-pay | Source: Home / Self Care | Attending: General Surgery

## 2023-04-29 ENCOUNTER — Other Ambulatory Visit: Payer: Self-pay

## 2023-04-29 ENCOUNTER — Ambulatory Visit (HOSPITAL_BASED_OUTPATIENT_CLINIC_OR_DEPARTMENT_OTHER): Payer: Commercial Managed Care - HMO | Admitting: Anesthesiology

## 2023-04-29 ENCOUNTER — Ambulatory Visit (HOSPITAL_BASED_OUTPATIENT_CLINIC_OR_DEPARTMENT_OTHER)
Admission: RE | Admit: 2023-04-29 | Discharge: 2023-04-29 | Disposition: A | Discharge status: Home / Self Care | Payer: Commercial Managed Care - HMO | Attending: General Surgery | Admitting: General Surgery

## 2023-04-29 DIAGNOSIS — K6282 Dysplasia of anus: Secondary | ICD-10-CM | POA: Insufficient documentation

## 2023-04-29 DIAGNOSIS — R85610 Atypical squamous cells of undetermined significance on cytologic smear of anus (ASC-US): Secondary | ICD-10-CM | POA: Diagnosis present

## 2023-04-29 DIAGNOSIS — I10 Essential (primary) hypertension: Secondary | ICD-10-CM | POA: Insufficient documentation

## 2023-04-29 DIAGNOSIS — Z21 Asymptomatic human immunodeficiency virus [HIV] infection status: Secondary | ICD-10-CM | POA: Insufficient documentation

## 2023-04-29 DIAGNOSIS — J449 Chronic obstructive pulmonary disease, unspecified: Secondary | ICD-10-CM | POA: Insufficient documentation

## 2023-04-29 DIAGNOSIS — K6289 Other specified diseases of anus and rectum: Secondary | ICD-10-CM | POA: Diagnosis not present

## 2023-04-29 DIAGNOSIS — Z79899 Other long term (current) drug therapy: Secondary | ICD-10-CM | POA: Insufficient documentation

## 2023-04-29 DIAGNOSIS — F640 Transsexualism: Secondary | ICD-10-CM | POA: Insufficient documentation

## 2023-04-29 DIAGNOSIS — F17210 Nicotine dependence, cigarettes, uncomplicated: Secondary | ICD-10-CM | POA: Diagnosis not present

## 2023-04-29 HISTORY — PX: HIGH RESOLUTION ANOSCOPY: SHX6345

## 2023-04-29 LAB — POCT I-STAT, CHEM 8
BUN: 13 mg/dL (ref 6–20)
Calcium, Ion: 1.17 mmol/L (ref 1.15–1.40)
Chloride: 107 mmol/L (ref 98–111)
Creatinine, Ser: 1.1 mg/dL — ABNORMAL HIGH (ref 0.44–1.00)
Glucose, Bld: 112 mg/dL — ABNORMAL HIGH (ref 70–99)
HCT: 49 % — ABNORMAL HIGH (ref 36.0–46.0)
Hemoglobin: 16.7 g/dL — ABNORMAL HIGH (ref 12.0–15.0)
Potassium: 4.1 mmol/L (ref 3.5–5.1)
Sodium: 140 mmol/L (ref 135–145)
TCO2: 20 mmol/L — ABNORMAL LOW (ref 22–32)

## 2023-04-29 SURGERY — ANOSCOPY, HIGH RESOLUTION
Anesthesia: General | Site: Rectum

## 2023-04-29 MED ORDER — ACETAMINOPHEN 500 MG PO TABS
ORAL_TABLET | ORAL | Status: AC
  Filled 2023-04-29: qty 2

## 2023-04-29 MED ORDER — HYDROMORPHONE HCL 1 MG/ML IJ SOLN: 0.2500 mg | INTRAMUSCULAR | Status: DC | PRN

## 2023-04-29 MED ORDER — FENTANYL CITRATE (PF) 100 MCG/2ML IJ SOLN
INTRAMUSCULAR | Status: DC | PRN
  Administered 2023-04-29: 50 ug via INTRAVENOUS

## 2023-04-29 MED ORDER — SODIUM CHLORIDE 0.9% FLUSH: 3.0000 mL | INTRAVENOUS | Status: DC | PRN

## 2023-04-29 MED ORDER — FENTANYL CITRATE (PF) 100 MCG/2ML IJ SOLN
INTRAMUSCULAR | Status: AC
  Filled 2023-04-29: qty 2

## 2023-04-29 MED ORDER — OXYCODONE HCL 5 MG PO TABS: 5.0000 mg | ORAL_TABLET | Freq: Once | ORAL | Status: DC | PRN

## 2023-04-29 MED ORDER — SODIUM CHLORIDE 0.9 % IV SOLN
INTRAVENOUS | Status: DC
Start: 2023-04-29 — End: 2023-04-29
  Administered 2023-04-29: 500 mL via INTRAVENOUS

## 2023-04-29 MED ORDER — LIDOCAINE HCL (CARDIAC) PF 100 MG/5ML IV SOSY
PREFILLED_SYRINGE | INTRAVENOUS | Status: DC | PRN
  Administered 2023-04-29: 50 mg via INTRAVENOUS

## 2023-04-29 MED ORDER — ONDANSETRON HCL 4 MG/2ML IJ SOLN
INTRAMUSCULAR | Status: DC | PRN
  Administered 2023-04-29: 4 mg via INTRAVENOUS

## 2023-04-29 MED ORDER — ACETAMINOPHEN 325 MG RE SUPP: 650.0000 mg | RECTAL | Status: DC | PRN

## 2023-04-29 MED ORDER — SODIUM CHLORIDE 0.9% FLUSH: 3.0000 mL | Freq: Two times a day (BID) | INTRAVENOUS | Status: DC

## 2023-04-29 MED ORDER — AMISULPRIDE (ANTIEMETIC) 5 MG/2ML IV SOLN: 10.0000 mg | Freq: Once | INTRAVENOUS | Status: DC | PRN

## 2023-04-29 MED ORDER — ACETIC ACID 5 % SOLN
Status: DC | PRN
  Administered 2023-04-29: 1 via TOPICAL

## 2023-04-29 MED ORDER — TRAMADOL HCL 50 MG PO TABS: 50.0000 mg | ORAL_TABLET | Freq: Four times a day (QID) | ORAL | 0 refills | Status: DC | PRN

## 2023-04-29 MED ORDER — DEXAMETHASONE SODIUM PHOSPHATE 4 MG/ML IJ SOLN
INTRAMUSCULAR | Status: DC | PRN
  Administered 2023-04-29: 8 mg via INTRAVENOUS

## 2023-04-29 MED ORDER — ONDANSETRON HCL 4 MG/2ML IJ SOLN: 4.0000 mg | Freq: Once | INTRAMUSCULAR | Status: DC | PRN

## 2023-04-29 MED ORDER — DEXMEDETOMIDINE HCL IN NACL 200 MCG/50ML IV SOLN
INTRAVENOUS | Status: DC | PRN
  Administered 2023-04-29: 4 ug via INTRAVENOUS

## 2023-04-29 MED ORDER — OXYCODONE HCL 5 MG/5ML PO SOLN: 5.0000 mg | Freq: Once | ORAL | Status: DC | PRN

## 2023-04-29 MED ORDER — PROPOFOL 10 MG/ML IV BOLUS
INTRAVENOUS | Status: DC | PRN
  Administered 2023-04-29: 250 mg via INTRAVENOUS
  Administered 2023-04-29: 10 mg via INTRAVENOUS

## 2023-04-29 MED ORDER — BUPIVACAINE-EPINEPHRINE 0.5% -1:200000 IJ SOLN
INTRAMUSCULAR | Status: DC | PRN
  Administered 2023-04-29: 15 mL

## 2023-04-29 MED ORDER — 0.9 % SODIUM CHLORIDE (POUR BTL) OPTIME
TOPICAL | Status: DC | PRN
  Administered 2023-04-29: 1000 mL

## 2023-04-29 MED ORDER — SODIUM CHLORIDE 0.9 % IV SOLN: 250.0000 mL | INTRAVENOUS | Status: DC | PRN

## 2023-04-29 MED ORDER — MIDAZOLAM HCL 2 MG/2ML IJ SOLN
INTRAMUSCULAR | Status: AC
  Filled 2023-04-29: qty 2

## 2023-04-29 MED ORDER — ACETAMINOPHEN 500 MG PO TABS
1000.0000 mg | ORAL_TABLET | ORAL | Status: AC
  Administered 2023-04-29: 1000 mg via ORAL

## 2023-04-29 MED ORDER — ACETAMINOPHEN 325 MG PO TABS: 650.0000 mg | ORAL_TABLET | ORAL | Status: DC | PRN

## 2023-04-29 MED ORDER — OXYCODONE HCL 5 MG PO TABS: 5.0000 mg | ORAL_TABLET | ORAL | Status: DC | PRN

## 2023-04-29 MED ORDER — MIDAZOLAM HCL 5 MG/5ML IJ SOLN
INTRAMUSCULAR | Status: DC | PRN
  Administered 2023-04-29: 2 mg via INTRAVENOUS

## 2023-04-29 SURGICAL SUPPLY — 35 items
APPLICATOR COTTON TIP 6 STRL (MISCELLANEOUS) IMPLANT
APPLICATOR COTTON TIP 6IN STRL (MISCELLANEOUS)
BENZOIN TINCTURE PRP APPL 2/3 (GAUZE/BANDAGES/DRESSINGS) ×2 IMPLANT
BLADE EXTENDED COATED 6.5IN (ELECTRODE) IMPLANT
BLADE SURG 10 STRL SS (BLADE) ×2 IMPLANT
BRIEF MESH DISP LRG (UNDERPADS AND DIAPERS) ×2 IMPLANT
COVER BACK TABLE 60X90IN (DRAPES) ×2 IMPLANT
DRAPE LAPAROTOMY 100X72 PEDS (DRAPES) ×2 IMPLANT
DRAPE UTILITY XL STRL (DRAPES) ×2 IMPLANT
ELECT REM PT RETURN 9FT ADLT (ELECTROSURGICAL) ×1
ELECTRODE REM PT RTRN 9FT ADLT (ELECTROSURGICAL) ×2 IMPLANT
GAUZE 4X4 16PLY ~~LOC~~+RFID DBL (SPONGE) ×2 IMPLANT
GAUZE PAD ABD 8X10 STRL (GAUZE/BANDAGES/DRESSINGS) IMPLANT
GAUZE SPONGE 4X4 12PLY STRL (GAUZE/BANDAGES/DRESSINGS) IMPLANT
GLOVE BIO SURGEON STRL SZ 6.5 (GLOVE) ×2 IMPLANT
GLOVE INDICATOR 6.5 STRL GRN (GLOVE) ×2 IMPLANT
KIT SIGMOIDOSCOPE (SET/KITS/TRAYS/PACK) IMPLANT
KIT TURNOVER CYSTO (KITS) ×2 IMPLANT
NDL HYPO 22X1.5 SAFETY MO (MISCELLANEOUS) ×2 IMPLANT
NEEDLE HYPO 22X1.5 SAFETY MO (MISCELLANEOUS) ×1
NS IRRIG 500ML POUR BTL (IV SOLUTION) ×2 IMPLANT
PACK BASIN DAY SURGERY FS (CUSTOM PROCEDURE TRAY) ×2 IMPLANT
PAD ARMBOARD 7.5X6 YLW CONV (MISCELLANEOUS) IMPLANT
PENCIL SMOKE EVACUATOR (MISCELLANEOUS) ×2 IMPLANT
SLEEVE SCD COMPRESS KNEE MED (STOCKING) ×2 IMPLANT
SPIKE FLUID TRANSFER (MISCELLANEOUS) ×2 IMPLANT
SPONGE SURGIFOAM ABS GEL 12-7 (HEMOSTASIS) IMPLANT
SUT CHROMIC 2 0 SH (SUTURE) IMPLANT
SUT CHROMIC 3 0 SH 27 (SUTURE) IMPLANT
SYR BULB IRRIG 60ML STRL (SYRINGE) ×2 IMPLANT
SYR CONTROL 10ML LL (SYRINGE) ×2 IMPLANT
TOWEL OR 17X24 6PK STRL BLUE (TOWEL DISPOSABLE) ×2 IMPLANT
TRAY DSU PREP LF (CUSTOM PROCEDURE TRAY) ×2 IMPLANT
TUBE CONNECTING 12X1/4 (SUCTIONS) ×2 IMPLANT
YANKAUER SUCT BULB TIP NO VENT (SUCTIONS) IMPLANT

## 2023-04-29 NOTE — Anesthesia Procedure Notes (Signed)
Procedure Name: LMA Insertion Date/Time: 04/29/2023 7:38 AM  Performed by: Earmon Phoenix, CRNAPre-anesthesia Checklist: Patient identified, Emergency Drugs available, Suction available, Patient being monitored and Timeout performed Patient Re-evaluated:Patient Re-evaluated prior to induction Oxygen Delivery Method: Circle system utilized Preoxygenation: Pre-oxygenation with 100% oxygen Induction Type: IV induction Ventilation: Mask ventilation without difficulty LMA: LMA inserted LMA Size: 4.0 Number of attempts: 1 Placement Confirmation: positive ETCO2 and breath sounds checked- equal and bilateral Tube secured with: Tape Dental Injury: Teeth and Oropharynx as per pre-operative assessment

## 2023-04-29 NOTE — H&P (Signed)
REFERRING PHYSICIAN:  Gardiner Barefoot, MD   PROVIDER:  Elenora Gamma, MD   MRN: [id number] DOB: 04-17-1971   Subjective   Chief Complaint: ASCUS       History of Present Illness: Holly Walker is a 52 y.o. adult who is seen today as an office consultation at the request of Dr. Luciana Axe for evaluation of ASCUS .  52 year old transgender female with HIV and ASCUS noted on recent anal Pap test.     Review of Systems: A complete review of systems was obtained from the patient.  I have reviewed this information and discussed as appropriate with the patient.  See HPI as well for other ROS.       Medical History: Past Medical History: DiagnosisDate            Hypertension        Patient Active Problem List Diagnosis Asymptomatic HIV infection (CMS/HHS-HCC) Pap smear of anus with ASCUS Routine screening for STI (sexually transmitted infection) Marijuana smoker, episodic     Past Surgical History: ProcedureLateralityDate            CHOLECYSTECTOMY                      1991     No Known Allergies   Current Outpatient Medications on File Prior to Visit MedicationSigDispenseRefill            amLODIPine (NORVASC) 5 MG tablet          Take 5 mg by mouth once daily                                 atorvastatin (LIPITOR) 10 MG tablet  1 tablet Orally Once a day                              estradioL (ESTRACE) 2 MG tablet     TAKE 2 TABLETS(4 MG) BY MOUTH IN THE MORNING AND AT BEDTIME                          finasteride (PROSCAR) 5 mg tablet   Take 5 mg by mouth once daily                                 phentermine (ADIPEX-P) 15 MG capsule      Take 15 mg by mouth every morning                         spironolactone (ALDACTONE) 100 MG tablet           Take 100 mg by mouth 2 (two) times daily                                 topiramate (TOPAMAX) 25 MG tablet            Take 25 mg by mouth 2 (two) times daily                            BIKTARVY 50-200-25 mg tablet         Take 1 tablet by mouth once daily  varenicline (CHANTIX STARTING MONTH PAK) tablet       TAKE AS DIRECTED PER PACKAGE                        No current facility-administered medications on file prior to visit.     Family History ProblemRelationAge of Onset            Obesity            Mother             High blood pressure (Hypertension)   Mother             High blood pressure (Hypertension)   Brother                 Social History   Tobacco Use Smoking StatusEvery Day Types:Cigarettes Smokeless TobaccoNever     Social History   Socioeconomic History Marital status:Life Partner Tobacco Use Smoking status:Every Day                         Types: Cigarettes Smokeless tobacco:Never Substance and Sexual Activity Alcohol use:Yes                         Comment: On occasion Drug RUE:AVWUJ   Social Determinants of Health   Financial Resource Strain: Medium Risk (01/04/2023)             Received from [facility]             Overall Financial Resource Strain (CARDIA)                        Difficulty of Paying Living Expenses: Somewhat hard Food Insecurity: Patient Declined (01/04/2023)             Received from [facility]             Hunger Vital Sign                        Worried About Running Out of Food in the Last Year: Patient declined                        Ran Out of Food in the Last Year: Patient declined Transportation Needs: Patient Declined (01/04/2023)             Received from [facility] - Transportation                        Lack of Transportation (Medical): Patient declined                        Lack of Transportation (Non-Medical): Patient declined Physical Activity: Sufficiently Active (01/04/2023)             Received from [facility]             Exercise Vital Sign                        Days of Exercise per Week: 2 days                        Minutes of Exercise per Session: 150+ min  Stress: Stress  Concern Present (01/04/2023)             Received from [facility] - Occupational Stress Questionnaire                        Feeling of Stress : Rather much Social Connections: Unknown (01/04/2023)             Received from [facility]             Social Connection and Isolation Panel [NHANES]                        Frequency of Communication with Friends and Family: More than three times a week                        Frequency of Social Gatherings with Friends and Family: Once a week                        Attends Religious Services: Patient declined                        Marital Status: Separated     Objective:     Vitals:   04/29/23 0559  BP: 131/73  Pulse: 82  Resp: 17  Temp: 98.1 F (36.7 C)  SpO2: 98%      Exam Gen: NAD CV:RRR Pulm: CTA Abd: soft       Labs, Imaging and Diagnostic Testing: CD4 562, HIV ND   Assessment and Plan: Diagnoses and all orders for this visit:   Pap smear of anus with ASCUS       52 year old transgender female who presents to the office for evaluation of an anal Pap test that showed ASCUS.  We discussed performing high-resolution anoscopy with possible biopsy or ablation.  We discussed postoperative discomfort and bleeding that can occur after the procedure.  We discussed the need for ongoing follow-up if biopsy shows a dysplasia.  I believe the patient understands this and is willing to proceed.   Vanita Panda, MD Colon and Rectal Surgery [facility]

## 2023-04-29 NOTE — Anesthesia Postprocedure Evaluation (Signed)
Anesthesia Post Note  Patient: [name]  Procedure(s) Performed: HIGH RESOLUTION ANOSCOPY WITH BIOPSY (Rectum)     Patient location during evaluation: PACU Anesthesia Type: General Level of consciousness: awake and alert, oriented and patient cooperative Pain management: pain level controlled Vital Signs Assessment: post-procedure vital signs reviewed and stable Respiratory status: spontaneous breathing, nonlabored ventilation and respiratory function stable Cardiovascular status: blood pressure returned to baseline and stable Postop Assessment: no apparent nausea or vomiting Anesthetic complications: no   No notable events documented.  Last Vitals:  Vitals:   04/29/23 0815 04/29/23 0827  BP: 102/70 109/71  Pulse: 77   Resp: 14   Temp: 36.7 C   SpO2: 98%     Last Pain:  Vitals:   04/29/23 0827  TempSrc:   PainSc: 0-No pain                 Lannie Fields

## 2023-04-29 NOTE — Discharge Instructions (Addendum)

## 2023-04-29 NOTE — Transfer of Care (Signed)
Immediate Anesthesia Transfer of Care Note  Patient: Holly Walker  Procedure(s) Performed: HIGH RESOLUTION ANOSCOPY WITH BIOPSY (Rectum)  Patient Location: PACU  Anesthesia Type:General  Level of Consciousness: awake, alert , oriented, and patient cooperative  Airway & Oxygen Therapy: Patient Spontanous Breathing and Patient connected to face mask oxygen  Post-op Assessment: Report given to RN and Post -op Vital signs reviewed and stable  Post vital signs: Reviewed and stable  Last Vitals:  Vitals Value Taken Time  BP    Temp    Pulse    Resp    SpO2      Last Pain:  Vitals:   04/29/23 0559  TempSrc: Oral  PainSc: 0-No pain      Patients Stated Pain Goal: 7 (04/29/23 0559)  Complications: No notable events documented.

## 2023-04-29 NOTE — Op Note (Signed)
04/29/2023  8:11 AM  PATIENT:  Holly Walker  52 y.o. adult  Patient Care Team: Christen Butter, NP as PCP - General (Nurse Practitioner)  PRE-OPERATIVE DIAGNOSIS:  ASCUS OF ANUS  POST-OPERATIVE DIAGNOSIS:  ASCUS OF ANUS  PROCEDURE:  HIGH RESOLUTION ANOSCOPY WITH BIOPSY   Surgeon(s): Romie Levee, MD  ASSISTANT: none   ANESTHESIA:   local and general  EBL: 2ml  Total I/O In: 200 [I.V.:200] Out: 2 [Blood:2]  SPECIMEN:  Source of Specimen:  L post and ant midline anal canal hypertrophied papilla  DISPOSITION OF SPECIMEN:  PATHOLOGY  COUNTS:  YES  PLAN OF CARE: Discharge to home after PACU  PATIENT DISPOSITION:  PACU - hemodynamically stable.  INDICATION: 52 y.o. HIV+ adult with ASCUS on anal pap  OR FINDINGS: Acetowhite staining around 2 hypertrophied anal papilla  DESCRIPTION: The patient was identified in the preoperative holding area and taken to the OR where they were laid supine on the operating room table in lithotomy position. General anesthesia was smoothly induced.  The patient was then prepped and draped in the usual sterile fashion. A surgical timeout was performed indicating the correct patient, procedure, positioning and preoperative antibioitics. SCDs were noted to be in place and functioning prior to the operation.   After this was completed, a sponge was soaked in 5% acetic acid was placed over the perianal region. This was allowed to soak for 2 minutes. The sponge was removed and the perianal region was evaluated with a colposcope.  There were no external lesions noted.  The internal anal canal was evaluated via anoscopy with a Hill-Ferguson anoscope.  There were 2 areas of acetowhite staining around anal papilla.  These were both removed and completely ablated.  After this was completed, hemostasis was achieved with electrocautery and all of the biopsy sites were closed using a 3-0 chromic suture.  A sterile dressing was applied over this. The patient was  then awakened from anesthesia and sent to the postanesthesia care unit in stable condition. All counts were correct operating room staff.   Vanita Panda, MD  Colorectal and General Surgery [facility]

## 2023-04-30 ENCOUNTER — Encounter (HOSPITAL_BASED_OUTPATIENT_CLINIC_OR_DEPARTMENT_OTHER): Payer: Self-pay | Admitting: General Surgery

## 2023-05-03 LAB — SURGICAL PATHOLOGY

## 2023-05-17 ENCOUNTER — Other Ambulatory Visit: Payer: Self-pay | Admitting: Medical-Surgical

## 2023-05-23 ENCOUNTER — Other Ambulatory Visit: Payer: Self-pay

## 2023-05-23 MED ORDER — VARENICLINE TARTRATE 1 MG PO TABS: 1.0000 mg | ORAL_TABLET | Freq: Two times a day (BID) | ORAL | 2 refills | Status: DC

## 2023-06-14 ENCOUNTER — Other Ambulatory Visit: Payer: Self-pay | Admitting: Medical-Surgical

## 2023-06-29 ENCOUNTER — Ambulatory Visit: Payer: Commercial Managed Care - HMO | Admitting: Internal Medicine

## 2023-07-05 ENCOUNTER — Other Ambulatory Visit: Payer: Self-pay

## 2023-07-05 ENCOUNTER — Ambulatory Visit: Payer: Commercial Managed Care - HMO | Admitting: Internal Medicine

## 2023-07-05 ENCOUNTER — Encounter: Payer: Self-pay | Admitting: Internal Medicine

## 2023-07-05 VITALS — BP 121/76 | HR 81 | Temp 98.0°F | Ht 68.0 in | Wt 238.0 lb

## 2023-07-05 DIAGNOSIS — Z23 Encounter for immunization: Secondary | ICD-10-CM

## 2023-07-05 DIAGNOSIS — Z113 Encounter for screening for infections with a predominantly sexual mode of transmission: Secondary | ICD-10-CM

## 2023-07-05 DIAGNOSIS — Z21 Asymptomatic human immunodeficiency virus [HIV] infection status: Secondary | ICD-10-CM

## 2023-07-05 DIAGNOSIS — R85610 Atypical squamous cells of undetermined significance on cytologic smear of anus (ASC-US): Secondary | ICD-10-CM

## 2023-07-05 NOTE — Assessment & Plan Note (Signed)
 Holly Walker is doing well and no changes indicated.  Will check labs today and otherwise can return in 6 months.

## 2023-07-05 NOTE — Assessment & Plan Note (Signed)
Will screen 

## 2023-07-05 NOTE — Assessment & Plan Note (Signed)
 Has been referred to Dr. Maisie Fus and will follow up with her again

## 2023-07-05 NOTE — Progress Notes (Signed)
   Subjective:    Patient ID: Holly Walker, adult    DOB: 08-Dec-1970, 53 y.o.   MRN: [id number]  HPI Holly Walker is here for follow-up of HIV. Continues on [place] and denies any missed doses.  Recently had a positive screening following an anal Pap and is followed now by Dr. Maisie Fus.  He has no complaints today.   Review of Systems  Constitutional:  Negative for fatigue.  Gastrointestinal:  Negative for diarrhea and nausea.  Skin:  Negative for rash.       Objective:   Physical Exam Eyes:     General: No scleral icterus. Pulmonary:     Effort: Pulmonary effort is normal.  Neurological:     Mental Status: She is alert.   SH: + tobacco        Assessment & Plan:

## 2023-07-06 ENCOUNTER — Ambulatory Visit: Payer: Commercial Managed Care - HMO | Admitting: Internal Medicine

## 2023-07-06 LAB — T-HELPER CELL (CD4) - (RCID CLINIC ONLY)
CD4 % Helper T Cell: 21 % — ABNORMAL LOW (ref 33–65)
CD4 T Cell Abs: 431 /uL (ref 400–1790)

## 2023-07-07 LAB — HIV-1 RNA QUANT-NO REFLEX-BLD
HIV 1 RNA Quant: NOT DETECTED {copies}/mL
HIV-1 RNA Quant, Log: NOT DETECTED {Log}

## 2023-07-07 LAB — RPR: RPR Ser Ql: NONREACTIVE

## 2023-07-12 ENCOUNTER — Ambulatory Visit (INDEPENDENT_AMBULATORY_CARE_PROVIDER_SITE_OTHER): Payer: Managed Care, Other (non HMO) | Admitting: Medical-Surgical

## 2023-07-12 ENCOUNTER — Encounter: Payer: Self-pay | Admitting: Medical-Surgical

## 2023-07-12 VITALS — BP 136/83 | HR 93 | Resp 20 | Ht 68.0 in | Wt 238.7 lb

## 2023-07-12 DIAGNOSIS — I10 Essential (primary) hypertension: Secondary | ICD-10-CM

## 2023-07-12 DIAGNOSIS — Z789 Other specified health status: Secondary | ICD-10-CM | POA: Diagnosis not present

## 2023-07-12 DIAGNOSIS — Z23 Encounter for immunization: Secondary | ICD-10-CM | POA: Diagnosis not present

## 2023-07-12 NOTE — Progress Notes (Unsigned)
 Subjective:  Patient ID: Holly Walker, adult    DOB: 29-Feb-1971, 53 y.o.   MRN: [id number]  Patient Care Team: Christen Butter, NP as PCP - General (Nurse Practitioner)   Chief Complaint:  Hypertension   HPI:  Holly Walker is a 53 y.o. adult presenting on 07/12/2023 for Hypertension   History, Exam,  Impression and Plan  Hypertension goal BP (blood pressure) < 130/80 Presents for hypertension follow-up. Pt is pleasant, animated and voluble. States takes amlodipine 5 mg daily spironolactone 100 mg as directed denies any needs for refill because her mail order pharmacy will notify provider when she needs refills.  Denies any side effects or complications from medications denies any chest pain, left arm pain, dizziness, lower extremity edema. BP improving and close to goal. Continue amlodipine and spironolactone. - Draw Lipid panel - Draw type CMP14+EGFR - Draw CBC  2. 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 Takes estradiol 4 mg p.o. daily as prescribed without side effects or complications.  Last testosterone and estradiol levels were drawn in August 2024. - Draw Testosterone - Draw Estradiol  3. Need for pneumococcal 20-valent conjugate vaccination Records reviewed and patient is due for pneumococcal vaccine.  Patient consents to vaccine at this time. -Administer pneumococcal conjugate vaccine 20-valent (Prevnar 20)  4. Need for shingles vaccine Records reviewed and patient is due for first shingrix vaccine.  Patient consents to vaccine at this time. -Administer zoster Recombinant (Shingrix )   Continue all other maintenance medications.  Follow up plan: Return in about 6 months (around 01/12/2024). Will discuss increasing amlodapine to 10mg  daily next visit   Relevant past medical, surgical, family, and social history reviewed and updated as indicated.  Allergies and medications reviewed and updated. Data reviewed: Chart in Epic.   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   History of shingles 03/2017   right flank   HIV infection (HCC)    Hypertension    IBS (irritable bowel syndrome)    Low serum thyroid stimulating hormone (TSH) 09/21/2017   Tobacco use     Past Surgical History:  Procedure Laterality Date   CHOLECYSTECTOMY     COLONOSCOPY  1993   CYST EXCISION     back   CYST REMOVAL LEG     HIGH RESOLUTION ANOSCOPY N/A 04/29/2023   Procedure: HIGH RESOLUTION ANOSCOPY WITH BIOPSY;  Surgeon: Romie Levee, MD;  Location: [facility];  Service: General;  Laterality: N/A;   INJECTION OF SILICONE OIL      Social History   Socioeconomic History   Marital status: Domestic Partner    Spouse name: Dominik   Number of children: Not on file   Years of education: Not on file   Highest education level: Not on file  Occupational History   Not on file  Tobacco Use   Smoking status: Every Day    Current packs/day: 1.00    Average packs/day: 1 pack/day for 20.0 years (20.0 ttl pk-yrs)    Types: Cigarettes   Smokeless tobacco: Never   Tobacco comments:    States she wants to quit  Vaping Use   Vaping status: Former  Substance and Sexual Activity   Alcohol use: Yes    Alcohol/week: 1.0 standard drink of alcohol    Types: 1 Glasses of wine per week    Comment: wine or mixed drink occasionally  Drug use: Yes    Frequency: 7.0 times per week    Types: Marijuana    Comment: daily   Sexual activity: Yes    Birth control/protection: None    Comment: accepted condoms  Other Topics Concern   Not on file  Social History Narrative   Not on file   Social Drivers of Health   Financial Resource Strain: Medium Risk (01/04/2023)   Overall Financial Resource Strain (CARDIA)    Difficulty of Paying Living Expenses: Somewhat hard  Food Insecurity: Patient Declined (01/04/2023)   Hunger Vital Sign    Worried About Running Out of Food in  the Last Year: Patient declined    Ran Out of Food in the Last Year: Patient declined  Transportation Needs: Patient Declined (01/04/2023)   PRAPARE - Administrator, Civil Service (Medical): Patient declined    Lack of Transportation (Non-Medical): Patient declined  Physical Activity: Sufficiently Active (01/04/2023)   Exercise Vital Sign    Days of Exercise per Week: 2 days    Minutes of Exercise per Session: 150+ min  Stress: Stress Concern Present (01/04/2023)   [employer] of Occupational Health - Occupational Stress Questionnaire    Feeling of Stress : Rather much  Social Connections: Unknown (01/04/2023)   Social Connection and Isolation Panel [NHANES]    Frequency of Communication with Friends and Family: More than three times a week    Frequency of Social Gatherings with Friends and Family: Once a week    Attends Religious Services: Patient declined    Database administrator or Organizations: Not on file    Attends Banker Meetings: Not on file    Marital Status: Separated  Intimate Partner Violence: Not on file    Outpatient Encounter Medications as of 07/12/2023  Medication Sig   amLODipine (NORVASC) 5 MG tablet TAKE 1 TABLET(5 MG) BY MOUTH DAILY   Ascorbic Acid (VITAMIN C) 1000 MG tablet Take 1,000 mg by mouth daily.   aspirin EC 81 MG tablet Take 1 tablet (81 mg total) by mouth daily.   atorvastatin (LIPITOR) 10 MG tablet Take 1 tablet (10 mg total) by mouth daily.   bictegravir-emtricitabine-tenofovir AF (BIKTARVY) 50-200-25 MG TABS tablet Take 1 tablet by mouth daily.   Cyanocobalamin (VITAMIN B-12 PO) Take by mouth.   estradiol (ESTRACE) 2 MG tablet TAKE 2 TABLETS(4 MG) BY MOUTH IN THE MORNING AND AT BEDTIME   finasteride (PROSCAR) 5 MG tablet Take 1 tablet (5 mg total) by mouth daily.   Minoxidil 5 % FOAM Apply 1 mL topically 2 (two) times daily.   Multiple Vitamin (MULTIVITAMIN) tablet Take 1 tablet by mouth daily.   phentermine 15 MG  capsule Take 1 capsule (15 mg total) by mouth every morning.   Pyridoxine HCl (VITAMIN B6) 100 MG TABS Take by mouth.   spironolactone (ALDACTONE) 100 MG tablet Take 1 tablet (100 mg total) by mouth 2 (two) times daily.   traMADol (ULTRAM) 50 MG tablet Take 1 tablet (50 mg total) by mouth every 6 (six) hours as needed.   tretinoin (RETIN-A) 0.1 % cream Apply topically at bedtime.   valACYclovir (VALTREX) 500 MG tablet TAKE 1 TABLET(500 MG) BY MOUTH TWICE DAILY   varenicline (CHANTIX) 1 MG tablet Take 1 tablet (1 mg total) by mouth 2 (two) times daily.   VITAMIN D PO Take by mouth.   [DISCONTINUED] topiramate (TOPAMAX) 25 MG tablet Take 1 tablet (25 mg total) by mouth 2 (two) times daily. (  Patient not taking: Reported on 07/12/2023)   No facility-administered encounter medications on file as of 07/12/2023.    No Known Allergies  Review of Systems      Objective:  BP 136/83 (BP Location: Left Arm, Cuff Size: Normal)   Pulse 93   Resp 20   Ht 5\' 8"  (1.727 m)   Wt 238 lb 11.2 oz (108.3 kg)   SpO2 98%   BMI 36.29 kg/m    Wt Readings from Last 3 Encounters:  07/12/23 238 lb 11.2 oz (108.3 kg)  07/05/23 238 lb (108 kg)  04/29/23 236 lb 1.6 oz (107.1 kg)    Physical Exam  Results for orders placed or performed in visit on 07/05/23  T-helper cell (CD4)- ([facility] clinic only)   Collection Time: 07/05/23 11:32 AM  Result Value Ref Range   CD4 T Cell Abs 431 400 - 1,790 /uL   CD4 % Helper T Cell 21 (L) 33 - 65 %  HIV-1 RNA quant-no reflex-bld   Collection Time: 07/05/23 11:40 AM  Result Value Ref Range   HIV 1 RNA Quant Not Detected Copies/mL   HIV-1 RNA Quant, Log Not Detected Log cps/mL  RPR   Collection Time: 07/05/23 11:40 AM  Result Value Ref Range   RPR Ser Ql NON-REACTIVE NON-REACTIVE       Pertinent labs & imaging results that were available during my care of the patient were reviewed by me and considered in my medical decision making.   Continue healthy lifestyle  choices, including diet (rich in fruits, vegetables, and lean proteins, and low in salt and simple carbohydrates) and exercise (at least 30 minutes of moderate physical activity daily).   The above assessment and management plan was discussed with the patient. The patient verbalized understanding of and has agreed to the management plan. Patient is aware to call the clinic if they develop any new symptoms or if symptoms persist or worsen. Patient is aware when to return to the clinic for a follow-up visit. Patient educated on when it is appropriate to go to the emergency department.   Maryelizabeth Kaufmann Student AGNP

## 2023-07-13 ENCOUNTER — Encounter: Payer: Self-pay | Admitting: Medical-Surgical

## 2023-07-13 LAB — LIPID PANEL
Chol/HDL Ratio: 4 ratio (ref 0.0–4.4)
Cholesterol, Total: 144 mg/dL (ref 100–199)
HDL: 36 mg/dL — ABNORMAL LOW (ref >39)
LDL Chol Calc (NIH): 77 mg/dL (ref 0–99)
Triglycerides: 184 mg/dL — ABNORMAL HIGH (ref 0–149)
VLDL Cholesterol Cal: 31 mg/dL (ref 5–40)

## 2023-07-13 LAB — SPECIMEN STATUS REPORT

## 2023-07-13 LAB — CMP14+EGFR
ALT: 18 IU/L (ref 0–32)
AST: 23 IU/L (ref 0–40)
Albumin: 4.6 g/dL (ref 3.8–4.9)
Alkaline Phosphatase: 91 IU/L (ref 44–121)
BUN/Creatinine Ratio: 10 (ref 9–23)
BUN: 11 mg/dL (ref 6–24)
Bilirubin Total: <0.2 mg/dL (ref 0.0–1.2)
CO2: 25 mmol/L (ref 20–29)
Calcium: 9.7 mg/dL (ref 8.7–10.2)
Chloride: 102 mmol/L (ref 96–106)
Creatinine, Ser: 1.06 mg/dL — ABNORMAL HIGH (ref 0.57–1.00)
Globulin, Total: 2.4 g/dL (ref 1.5–4.5)
Glucose: 83 mg/dL (ref 70–99)
Potassium: 4.3 mmol/L (ref 3.5–5.2)
Sodium: 139 mmol/L (ref 134–144)
Total Protein: 7 g/dL (ref 6.0–8.5)
eGFR: 64 mL/min/{1.73_m2} (ref >59)

## 2023-07-13 LAB — CBC
Hematocrit: 48.5 % — ABNORMAL HIGH (ref 34.0–46.6)
Hemoglobin: 16.2 g/dL — ABNORMAL HIGH (ref 11.1–15.9)
MCH: 36 pg — ABNORMAL HIGH (ref 26.6–33.0)
MCHC: 33.4 g/dL (ref 31.5–35.7)
MCV: 108 fL — ABNORMAL HIGH (ref 79–97)
Platelets: 238 10*3/uL (ref 150–450)
RBC: 4.5 x10E6/uL (ref 3.77–5.28)
RDW: 11.8 % (ref 11.7–15.4)
WBC: 10.2 10*3/uL (ref 3.4–10.8)

## 2023-07-13 LAB — TESTOSTERONE: Testosterone: 219 ng/dL — ABNORMAL HIGH (ref 4–50)

## 2023-07-13 LAB — ESTRADIOL: Estradiol: 88.2 pg/mL

## 2023-07-13 NOTE — Progress Notes (Signed)
 Medical screening examination/treatment was performed by qualified clinical staff member and as supervising provider I was immediately available for consultation/collaboration. I have reviewed documentation and agree with assessment and plan.  Thayer Ohm, DNP, APRN, FNP-BC [facility]

## 2023-07-14 ENCOUNTER — Other Ambulatory Visit: Payer: Self-pay | Admitting: Internal Medicine

## 2023-07-15 ENCOUNTER — Telehealth: Admitting: Physician Assistant

## 2023-07-15 ENCOUNTER — Ambulatory Visit
Admission: EM | Admit: 2023-07-15 | Discharge: 2023-07-15 | Disposition: A | Discharge status: Home / Self Care | Attending: Family Medicine | Admitting: Family Medicine

## 2023-07-15 DIAGNOSIS — J3489 Other specified disorders of nose and nasal sinuses: Secondary | ICD-10-CM | POA: Diagnosis not present

## 2023-07-15 DIAGNOSIS — J111 Influenza due to unidentified influenza virus with other respiratory manifestations: Secondary | ICD-10-CM

## 2023-07-15 DIAGNOSIS — R6889 Other general symptoms and signs: Secondary | ICD-10-CM | POA: Diagnosis not present

## 2023-07-15 MED ORDER — PSEUDOEPH-BROMPHEN-DM 30-2-10 MG/5ML PO SYRP: 5.0000 mL | ORAL_SOLUTION | Freq: Four times a day (QID) | ORAL | 0 refills | Status: DC | PRN

## 2023-07-15 MED ORDER — FLUTICASONE PROPIONATE 50 MCG/ACT NA SUSP: 2.0000 | Freq: Every day | NASAL | 0 refills | Status: DC

## 2023-07-15 MED ORDER — PREDNISONE 20 MG PO TABS: ORAL_TABLET | ORAL | 0 refills | Status: DC

## 2023-07-15 NOTE — Discharge Instructions (Addendum)
 Advised patient that COVID-19 and influenza were negative.  Advised patient may start prednisone tomorrow morning for sinus nasal pressure.  Encouraged increase daily water intake to 64 ounces per day while taking this medication.  Advised if headache and/or diaphoresis worsens please go to nearest ED for further evaluation.

## 2023-07-15 NOTE — ED Provider Notes (Signed)
 [facility]    CSN: [id number] Arrival date & time: 07/15/23  1926      History   Chief Complaint Chief Complaint  Patient presents with   URI    HPI Holly Walker is a 53 y.o. adult.   HPI 53 year old female presents with cough, congestion and left sided facial pressure/sinus congestion with headache for 3 days.  Patient reports having pneumonia vaccine, shingles vaccine and starting Chantix on Tuesday with her PCP.  Reports HIV positive with CD4 on detectable. PMH significant for transgender person (female to female), HIV, and HTN.  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, 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  07/02/2022   Rosacea 07/02/2022   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   Routine screening for STI (sexually transmitted infection) 12/11/2021   Abscess of lower back 03/18/2021   Need for prophylactic vaccination and inoculation against viral hepatitis 01/29/2021   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   Encounter for long-term (current) use of high-risk medication 01/09/2021   Asymptomatic HIV infection, with no history of HIV-related illness (HCC) 01/07/2021   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   Epidermoid cyst of skin 02/15/2018   Hair loss 02/15/2018   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   Hypertension goal BP (blood pressure) < 130/80 10/12/2017   Breast lump or  mass 09/29/2017   Endocrine disorder 09/20/2017   Marijuana smoker, episodic 09/20/2017   Tobacco use    IBS (irritable bowel syndrome)    History of shingles 03/10/2017    Past Surgical History:  Procedure Laterality Date   CHOLECYSTECTOMY     COLONOSCOPY  1993   CYST EXCISION     back   CYST REMOVAL LEG     HIGH RESOLUTION ANOSCOPY N/A 04/29/2023   Procedure: HIGH RESOLUTION ANOSCOPY WITH BIOPSY;  Surgeon: Romie Levee, MD;  Location: [facility];  Service: General;  Laterality: N/A;   INJECTION OF SILICONE OIL         Home Medications    Prior to Admission medications   Medication Sig Start Date End Date Taking? Authorizing Provider  predniSONE (DELTASONE) 20 MG tablet Take 3 tabs PO daily x 5 days. 07/15/23  Yes Trevor Iha, FNP  amLODipine (NORVASC) 5 MG tablet TAKE 1 TABLET(5 MG) BY MOUTH DAILY 01/24/23   Christen Butter, NP  Ascorbic Acid (VITAMIN C) 1000 MG tablet Take 1,000 mg by mouth daily.    [provider]  aspirin EC 81 MG tablet Take 1 tablet (81 mg total) by mouth daily. 01/05/19   Carlis Stable, PA-C  atorvastatin (LIPITOR) 10 MG tablet Take 1 tablet (10 mg total) by mouth daily. 01/24/23   Christen Butter, NP  BIKTARVY 50-200-25 MG TABS tablet TAKE 1 TABLET BY MOUTH DAILY 07/14/23   Comer, Belia Heman, MD  brompheniramine-pseudoephedrine-DM 30-2-10  MG/5ML syrup Take 5 mLs by mouth 4 (four) times daily as needed. 07/15/23   Margaretann Loveless, PA-C  Cyanocobalamin (VITAMIN B-12 PO) Take by mouth.    [provider]  estradiol (ESTRACE) 2 MG tablet TAKE 2 TABLETS(4 MG) BY MOUTH IN THE MORNING AND AT BEDTIME 04/18/23   Christen Butter, NP  finasteride (PROSCAR) 5 MG tablet Take 1 tablet (5 mg total) by mouth daily. 01/24/23   Christen Butter, NP  fluticasone (FLONASE) 50 MCG/ACT nasal spray Place 2 sprays into both nostrils daily. 07/15/23   Margaretann Loveless, PA-C  Minoxidil 5 % FOAM Apply 1 mL topically 2 (two) times daily. 12/25/21    Christen Butter, NP  Multiple Vitamin (MULTIVITAMIN) tablet Take 1 tablet by mouth daily.    [provider]  phentermine 15 MG capsule Take 1 capsule (15 mg total) by mouth every morning. 04/11/23   Christen Butter, NP  Pyridoxine HCl (VITAMIN B6) 100 MG TABS Take by mouth.    [provider]  spironolactone (ALDACTONE) 100 MG tablet Take 1 tablet (100 mg total) by mouth 2 (two) times daily. 07/06/22   Christen Butter, NP  traMADol (ULTRAM) 50 MG tablet Take 1 tablet (50 mg total) by mouth every 6 (six) hours as needed. 04/29/23   Romie Levee, MD  tretinoin (RETIN-A) 0.1 % cream Apply topically at bedtime. 04/11/23   Christen Butter, NP  valACYclovir (VALTREX) 500 MG tablet TAKE 1 TABLET(500 MG) BY MOUTH TWICE DAILY 06/14/23   Christen Butter, NP  varenicline (CHANTIX) 1 MG tablet Take 1 tablet (1 mg total) by mouth 2 (two) times daily. 05/23/23   Christen Butter, NP  VITAMIN D PO Take by mouth.    [provider]    Family History Family History  Problem Relation Age of Onset   Breast cancer Paternal Aunt    Colon polyps Maternal Grandmother    Stroke Maternal Grandmother    Skin cancer Maternal Grandmother    Colon polyps Maternal Grandfather    Heart attack Maternal Grandfather    Valvular heart disease Maternal Grandfather    Diabetes Maternal Grandfather    Diabetes Paternal Grandmother    Stroke Paternal Grandfather    Leukemia Other    Colon cancer Neg Hx    Esophageal cancer Neg Hx    Stomach cancer Neg Hx    Rectal cancer Neg Hx     Social History Social History   Tobacco Use   Smoking status: Former    Current packs/day: 0.00    Average packs/day: 1 pack/day for 20.0 years (20.0 ttl pk-yrs)    Types: Cigarettes    Quit date: 07/12/2023   Smokeless tobacco: Never   Tobacco comments:    States she wants to quit  Vaping Use   Vaping status: Former  Substance Use Topics   Alcohol use: Yes    Alcohol/week: 1.0 standard drink of alcohol    Types: 1 Glasses of wine  per week    Comment: wine or mixed drink occasionally   Drug use: Yes    Frequency: 7.0 times per week    Types: Marijuana    Comment: daily     Allergies   Patient has no known allergies.   Review of Systems Review of Systems  HENT:  Positive for congestion, sinus pressure and sinus pain.   Respiratory:  Positive for cough.   All other systems reviewed and are negative.    Physical Exam Triage Vital Signs ED Triage  Vitals  Encounter Vitals Group     BP      Systolic BP Percentile      Diastolic BP Percentile      Pulse      Resp      Temp      Temp src      SpO2      Weight      Height      Head Circumference      Peak Flow      Pain Score      Pain Loc      Pain Education      Exclude from Growth Chart    No data found.  Updated Vital Signs BP (!) 145/98   Pulse (!) 112   Temp 98.7 F (37.1 C)   Resp 16   SpO2 98%      Physical Exam Vitals and nursing note reviewed.  Constitutional:      General: She is not in acute distress.    Appearance: Normal appearance. She is normal weight. She is ill-appearing and diaphoretic.  HENT:     Head: Normocephalic and atraumatic.     Right Ear: Tympanic membrane, ear canal and external ear normal.     Left Ear: Tympanic membrane, ear canal and external ear normal.     Nose: Rhinorrhea present.     Comments: Turbinates are erythematous/edematous    Mouth/Throat:     Mouth: Mucous membranes are moist.     Pharynx: Oropharynx is clear.  Eyes:     Extraocular Movements: Extraocular movements intact.     Conjunctiva/sclera: Conjunctivae normal.     Pupils: Pupils are equal, round, and reactive to light.  Cardiovascular:     Rate and Rhythm: Regular rhythm. Tachycardia present.     Pulses: Normal pulses.     Heart sounds: Normal heart sounds. No murmur heard.    No friction rub. No gallop.  Pulmonary:     Effort: Pulmonary effort is normal.     Breath sounds: Normal breath sounds. No wheezing, rhonchi or  rales.     Comments: Infrequent productive cough noted on exam Musculoskeletal:        General: Normal range of motion.     Cervical back: Normal range of motion and neck supple.  Skin:    General: Skin is warm.  Neurological:     General: No focal deficit present.     Mental Status: She is alert and oriented to person, place, and time. Mental status is at baseline.  Psychiatric:        Mood and Affect: Mood normal.        Behavior: Behavior normal.        Thought Content: Thought content normal.      UC Treatments / Results  Labs (all labs ordered are listed, but only abnormal results are displayed) Labs Reviewed - No data to display  EKG   Radiology No results found.  Procedures Procedures (including critical care time)  Medications Ordered in UC Medications - No data to display  Initial Impression / Assessment and Plan / UC Course  I have reviewed the triage vital signs and the nursing notes.  Pertinent labs & imaging results that were available during my care of the patient were reviewed by me and considered in my medical decision making (see chart for details).     MDM: 1.  Cough, unspecified type Final Clinical Impressions(s) / UC Diagnoses   Final diagnoses:  Influenza-like illness  Sinus pressure     Discharge Instructions      Advised patient that COVID-19 and influenza were negative.  Advised patient may start prednisone tomorrow morning for sinus nasal pressure.  Encouraged increase daily water intake to 64 ounces per day while taking this medication.  Advised if headache and/or diaphoresis worsens please go to nearest ED for further evaluation.     ED Prescriptions     Medication Sig Dispense Auth. Provider   predniSONE (DELTASONE) 20 MG tablet Take 3 tabs PO daily x 5 days. 15 tablet Trevor Iha, FNP      PDMP not reviewed this encounter.   Trevor Iha, FNP 07/15/23 2014

## 2023-07-15 NOTE — ED Triage Notes (Addendum)
 Pt presents to uc with co of cough, congestion, facial pain and pressure and ha since Tuesday. Pt reports she was vaccinated for pneumonia and shingles on Tuesday. Pt reports HIV pos CD4 undetectable

## 2023-07-15 NOTE — Progress Notes (Signed)
 E visit for Flu like symptoms   We are sorry that you are not feeling well.  Here is how we plan to help! Based on what you have shared with me it looks like you may have flu-like symptoms that should be watched but do not seem to indicate anti-viral treatment.  Influenza or "the flu" is   an infection caused by a respiratory virus. The flu virus is highly contagious and persons who did not receive their yearly flu vaccination may "catch" the flu from close contact.  We have anti-viral medications to treat the viruses that cause this infection. They are not a "cure" and only shorten the course of the infection. These prescriptions are most effective when they are given within the first 2 days of "flu" symptoms.   Based upon your symptoms and potential risk factors I recommend that you follow the flu symptoms recommendation that I have listed below.  This is an infection that is most likely caused by a virus. There are no specific treatments other than to help you with the symptoms until the infection runs its course.  We are sorry you are not feeling well.  Here is how we plan to help!  For nasal congestion, you may use an oral decongestants such as Mucinex D or if you have glaucoma or high blood pressure use plain Mucinex.  Saline nasal spray or nasal drops can help and can safely be used as often as needed for congestion.  For your congestion, I have prescribed Fluticasone nasal spray one spray in each nostril twice a day  If you do not have a history of heart disease, hypertension, diabetes or thyroid disease, prostate/bladder issues or glaucoma, you may also use Sudafed to treat nasal congestion.  It is highly recommended that you consult with a pharmacist or your primary care physician to ensure this medication is safe for you to take.     If you have a cough, you may use cough suppressants such as Delsym and Robitussin.  If you have glaucoma or high blood pressure, you can also use Coricidin  HBP.   For cough I have prescribed for you Bromfed DM cough syrup Take 5mL every 6 hours as needed for cough.  If you have a sore or scratchy throat, use a saltwater gargle-  to  teaspoon of salt dissolved in a 4-ounce to 8-ounce glass of warm water.  Gargle the solution for approximately 15-30 seconds and then spit.  It is important not to swallow the solution.  You can also use throat lozenges/cough drops and Chloraseptic spray to help with throat pain or discomfort.  Warm or cold liquids can also be helpful in relieving throat pain.  For headache, pain or general discomfort, you can use Ibuprofen or Tylenol as directed.   Some authorities believe that zinc sprays or the use of Echinacea may shorten the course of your symptoms.  HOME CARE Only take medications as instructed by your medical team. Be sure to drink plenty of fluids. Water is fine as well as fruit juices, sodas and electrolyte beverages. You may want to stay away from caffeine or alcohol. If you are nauseated, try taking small sips of liquids. How do you know if you are getting enough fluid? Your urine should be a pale yellow or almost colorless. Get rest. Taking a steamy shower or using a humidifier may help nasal congestion and ease sore throat pain. You can place a towel over your head and breathe in the  steam from hot water coming from a faucet. Using a saline nasal spray works much the same way. Cough drops, hard candies and sore throat lozenges may ease your cough. Avoid close contacts especially the very young and the elderly Cover your mouth if you cough or sneeze Always remember to wash your hands.   GET HELP RIGHT AWAY IF: You develop worsening fever. If your symptoms do not improve within 10 days You develop yellow or green discharge from your nose over 3 days. You have coughing fits You develop a severe head ache or visual changes. You develop shortness of breath, difficulty breathing or start having chest  pain Your symptoms persist after you have completed your treatment plan  ANYONE WHO HAS FLU SYMPTOMS SHOULD: Stay home. The flu is highly contagious and going out or to work exposes others! Be sure to drink plenty of fluids. Water is fine as well as fruit juices, sodas and electrolyte beverages. You may want to stay away from caffeine or alcohol. If you are nauseated, try taking small sips of liquids. How do you know if you are getting enough fluid? Your urine should be a pale yellow or almost colorless. Get rest. Taking a steamy shower or using a humidifier may help nasal congestion and ease sore throat pain. Using a saline nasal spray works much the same way. Cough drops, hard candies and sore throat lozenges may ease your cough. Line up a caregiver. Have someone check on you regularly.   GET HELP RIGHT AWAY IF: You cannot keep down liquids or your medications. You become short of breath Your fell like you are going to pass out or loose consciousness. Your symptoms persist after you have completed your treatment plan MAKE SURE YOU  Understand these instructions. Will watch your condition. Will get help right away if you are not doing well or get worse.  Your e-visit answers were reviewed by a board certified advanced clinical practitioner to complete your personal care plan.  Depending on the condition, your plan could have included both over the counter or prescription medications.  If there is a problem please reply  once you have received a response from your provider.  Your safety is important to [place].  If you have drug allergies check your prescription carefully.    You can use MyChart to ask questions about today's visit, request a non-urgent call back, or ask for a work or school excuse for 24 hours related to this e-Visit. If it has been greater than 24 hours you will need to follow up with your provider, or enter a new e-Visit to address those concerns.  You will get an e-mail  in the next two days asking about your experience.  I hope that your e-visit has been valuable and will speed your recovery. Thank you for using e-visits.   I have spent 5 minutes in review of e-visit questionnaire, review and updating patient chart, medical decision making and response to patient.   Margaretann Loveless, PA-C

## 2023-07-16 ENCOUNTER — Telehealth: Payer: Self-pay

## 2023-07-19 ENCOUNTER — Other Ambulatory Visit: Payer: Self-pay | Admitting: Medical-Surgical

## 2023-07-19 DIAGNOSIS — Z789 Other specified health status: Secondary | ICD-10-CM

## 2023-07-19 NOTE — Telephone Encounter (Unsigned)
 Copied from CRM [id number]. Topic: Clinical - Medication Refill >> Jul 19, 2023 10:09 AM Everette C wrote: Most Recent Primary Care Visit:  Provider: Christen Butter  Department: [facility]  Visit Type: OFFICE VISIT  Date: 07/12/2023  Medication: spironolactone (ALDACTONE) 100 MG tablet [[id number]]  Has the patient contacted their pharmacy? Yes (Agent: If no, request that the patient contact the pharmacy for the refill. If patient does not wish to contact the pharmacy document the reason why and proceed with request.) (Agent: If yes, when and what did the pharmacy advise?)  Is this the correct pharmacy for this prescription? Yes If no, delete pharmacy and type the correct one.  This is the patient's preferred pharmacy:  [facility] [phone] @ [address] [place], [place] - [address] [place] [address] Phone: [phone] Fax: [phone]   Has the prescription been filled recently? Yes  Is the patient out of the medication? Yes  Has the patient been seen for an appointment in the last year OR does the patient have an upcoming appointment? Yes  Can we respond through MyChart? No  Agent: Please be advised that Rx refills may take up to 3 business days. We ask that you follow-up with your pharmacy.

## 2023-07-25 MED ORDER — SPIRONOLACTONE 100 MG PO TABS: 100.0000 mg | ORAL_TABLET | Freq: Two times a day (BID) | ORAL | 3 refills | Status: AC

## 2023-07-25 NOTE — Telephone Encounter (Signed)
 Requesting rx rfof spironolactone 100mg  tab Last written 07/06/2022 Last OV 07/12/2023 Upcoming appt 01/13/2024

## 2023-09-07 ENCOUNTER — Other Ambulatory Visit: Payer: Self-pay | Admitting: Medical-Surgical

## 2023-10-06 ENCOUNTER — Ambulatory Visit: Payer: Self-pay | Admitting: *Deleted

## 2023-10-06 NOTE — Telephone Encounter (Signed)
  Chief Complaint: swelling bilateral feet and ankles Symptoms: bilateral feet ankle swelling . Skin turns "white" with bending feet and ankles. No pain, elevates feet and swelling decreases.  Frequency: 1 week  Pertinent Negatives: Patient denies chest pain no difficulty breathing no fever no discoloration of skin at rest.  Disposition: [] ED /[x] Urgent Care (no appt availability in office) / [] Appointment(In office/virtual)/ []  [facility]/ [] Home Care/ [] Refused Recommended Disposition /[] [facility]/ []  Follow-up with PCP Additional Notes:    No available appt with PCP . No available appt noted until 10/14/23. Called [facility] to assist with appt. Patient unable to come in tomorrow and no available appt until next week. Patient verbalized she will go to UC.       Copied from CRM [id number]. Topic: Clinical - Red Word Triage >> Oct 06, 2023  2:34 PM Kevelyn M wrote: Red Word that prompted transfer to Nurse Triage: swelling in feet and ankles for a week. Reason for Disposition  MILD or MODERATE ankle swelling (e.g., can't move joint normally, can't do usual activities) (Exceptions: Itchy, localized swelling; swelling is chronic.)  Answer Assessment - Initial Assessment Questions 1. LOCATION: "Which ankle is swollen?" "Where is the swelling?"     Both  2. ONSET: "When did the swelling start?"     1 week ago  3. SWELLING: "How bad is the swelling?" Or, "How large is it?" (e.g., mild, moderate, severe; size of localized swelling)    - NONE: No joint swelling.   - LOCALIZED: Localized; small area of puffy or swollen skin (e.g., insect bite, skin irritation).   - MILD: Joint looks or feels mildly swollen or puffy.   - MODERATE: Swollen; interferes with normal activities (e.g., work or school); decreased range of movement; may be limping.   - SEVERE: Very swollen; can't move swollen joint at all; limping a lot or unable to walk.     Localized feet and ankles  4. PAIN: "Is there  any pain?" If Yes, ask: "How bad is it?" (Scale 1-10; or mild, moderate, severe)   - NONE (0): no pain.   - MILD (1-3): doesn't interfere with normal activities.    - MODERATE (4-7): interferes with normal activities (e.g., work or school) or awakens from sleep, limping.    - SEVERE (8-10): excruciating pain, unable to do any normal activities, unable to walk.      No pain 5. CAUSE: "What do you think caused the ankle swelling?"     Not sure  6. OTHER SYMPTOMS: "Do you have any other symptoms?" (e.g., fever, chest pain, difficulty breathing, calf pain)     None  7. PREGNANCY: "Is there any chance you are pregnant?" "When was your last menstrual period?"     na  Protocols used: Ankle Swelling-A-AH

## 2023-10-10 ENCOUNTER — Other Ambulatory Visit: Payer: Self-pay

## 2023-10-10 ENCOUNTER — Other Ambulatory Visit: Payer: Self-pay | Admitting: Medical-Surgical

## 2023-10-10 DIAGNOSIS — Z789 Other specified health status: Secondary | ICD-10-CM

## 2023-10-10 DIAGNOSIS — I10 Essential (primary) hypertension: Secondary | ICD-10-CM

## 2023-10-10 DIAGNOSIS — L659 Nonscarring hair loss, unspecified: Secondary | ICD-10-CM

## 2023-10-10 MED ORDER — AMLODIPINE BESYLATE 5 MG PO TABS: ORAL_TABLET | ORAL | 0 refills | Status: DC

## 2023-10-10 MED ORDER — FINASTERIDE 5 MG PO TABS: 5.0000 mg | ORAL_TABLET | Freq: Every day | ORAL | 0 refills | Status: DC

## 2023-10-14 ENCOUNTER — Telehealth: Payer: Self-pay | Admitting: Medical-Surgical

## 2023-10-14 ENCOUNTER — Other Ambulatory Visit: Payer: Self-pay

## 2023-10-14 DIAGNOSIS — Z7689 Persons encountering health services in other specified circumstances: Secondary | ICD-10-CM

## 2023-10-14 MED ORDER — TOPIRAMATE 25 MG PO TABS: 25.0000 mg | ORAL_TABLET | Freq: Two times a day (BID) | ORAL | 1 refills | Status: DC

## 2023-10-14 NOTE — Telephone Encounter (Signed)
 Copied from CRM [id number]. Topic: Clinical - Medication Refill >> Oct 14, 2023 11:37 AM Danelle Dunning F wrote: Caller: Sarah  Calling From : [facility]    Patient's pharmacy called in stating they put in a request for the prescription listed below and have not heard back. Please research and contact pharmacy with updated information. Agent did not see the following prescription in the patient's current med list.   Medication:  topiramate  25 mg tablet   Taken twice daily (Last prescription was written on 04/2023  Has the patient contacted their pharmacy? Yes   This is the patient's preferred pharmacy:  [facility] [phone] @ [address] [place], [place] - [address] [place] [address] Phone: [phone] Fax: [phone]  Is this the correct pharmacy for this prescription? Yes   Has the prescription been filled recently? No  Is the patient out of the medication? Yes  Has the patient been seen for an appointment in the last year OR does the patient have an upcoming appointment? Yes  Can we respond through MyChart? Yes  Agent: Please be advised that Rx refills may take up to 3 business days. We ask that you follow-up with your pharmacy.

## 2023-10-14 NOTE — Telephone Encounter (Signed)
 [facility] requests topiramate  25mg . Medication is not on the pt's current med list and was last written for in 2024.

## 2023-10-17 MED ORDER — TOPIRAMATE 25 MG PO TABS: 25.0000 mg | ORAL_TABLET | Freq: Two times a day (BID) | ORAL | 1 refills | Status: AC

## 2023-10-24 ENCOUNTER — Other Ambulatory Visit: Payer: Self-pay

## 2023-10-24 ENCOUNTER — Ambulatory Visit
Admission: EM | Admit: 2023-10-24 | Discharge: 2023-10-24 | Disposition: A | Discharge status: Home / Self Care | Attending: Family Medicine | Admitting: Family Medicine

## 2023-10-24 ENCOUNTER — Ambulatory Visit

## 2023-10-24 DIAGNOSIS — R609 Edema, unspecified: Secondary | ICD-10-CM | POA: Diagnosis not present

## 2023-10-24 DIAGNOSIS — R600 Localized edema: Secondary | ICD-10-CM | POA: Diagnosis not present

## 2023-10-24 DIAGNOSIS — I10 Essential (primary) hypertension: Secondary | ICD-10-CM

## 2023-10-24 LAB — POCT URINALYSIS DIP (MANUAL ENTRY)
Bilirubin, UA: NEGATIVE
Blood, UA: NEGATIVE
Glucose, UA: NEGATIVE mg/dL
Ketones, POC UA: NEGATIVE mg/dL
Leukocytes, UA: NEGATIVE
Nitrite, UA: NEGATIVE
Protein Ur, POC: NEGATIVE mg/dL
Spec Grav, UA: 1.02 (ref 1.010–1.025)
Urobilinogen, UA: 0.2 U/dL
pH, UA: 7 (ref 5.0–8.0)

## 2023-10-24 MED ORDER — VERAPAMIL HCL ER 120 MG PO CP24: 120.0000 mg | ORAL_CAPSULE | Freq: Every day | ORAL | 0 refills | Status: DC

## 2023-10-24 NOTE — Discharge Instructions (Signed)
 Stop your amlodipine  Start taking Verelan (verapamil) These are both calcium  channel blocking medicines to reduce blood pressure, verapamil has a low risk of edema, amlodipine  has a high risk of edema Hopefully this will change will take care of your ankle swelling You will be called if there are any significant abnormalities on your blood work Your blood test will be available on MyChart

## 2023-10-24 NOTE — ED Triage Notes (Addendum)
 Bil feet and ankle swelling on and off for 3 weeks. Could not be seen by pcp and was advised to come to urgent care. Stands on feet a lot.

## 2023-10-24 NOTE — ED Provider Notes (Signed)
 [name]    CSN: [id number] Arrival date & time: 10/24/23  1355      History   Chief Complaint Chief Complaint  Patient presents with   Leg Swelling    HPI Holly Walker is a 53 y.o. adult.   HPI  Holly Walker is here for bilateral pedal edema.  It has been present off-and-on for a couple of weeks, but much worse the last couple of days.  It is uncomfortable.  She has no shortness of breath or chest pain.  No swelling in her abdomen or hands.  She has never had this problem before.  No history of kidney or liver disease.  No history of heart disease.  Her cardiac score in 2024 was 1.  She does have pulmonary emphysema from many years of smoking's but has quit smoking.  No new medicines.  Has been on amlodipine  for many years successfully.  Has also been on minoxidil  for hair loss (topical). 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, 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  07/02/2022   Rosacea 07/02/2022   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   Routine screening for STI (sexually transmitted infection) 12/11/2021   Abscess of lower back 03/18/2021   Need for prophylactic vaccination and inoculation against viral hepatitis 01/29/2021   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   Encounter for long-term (current) use of high-risk medication  01/09/2021   Asymptomatic HIV infection, with no history of HIV-related illness (HCC) 01/07/2021   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   Epidermoid cyst of skin 02/15/2018   Hair loss 02/15/2018   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   Hypertension goal BP (blood pressure) < 130/80 10/12/2017   Breast lump or mass 09/29/2017   Endocrine disorder 09/20/2017   Marijuana smoker, episodic 09/20/2017   Tobacco use    IBS (irritable bowel syndrome)    History of shingles 03/10/2017    Past Surgical History:  Procedure Laterality Date   CHOLECYSTECTOMY     COLONOSCOPY  1993   CYST EXCISION     back   CYST REMOVAL LEG     HIGH RESOLUTION ANOSCOPY N/A 04/29/2023   Procedure: HIGH RESOLUTION ANOSCOPY WITH BIOPSY;  Surgeon: Joyce Nixon, MD;  Location: [facility];  Service: General;  Laterality: N/A;   INJECTION OF SILICONE OIL         Home Medications    Prior to Admission medications   Medication Sig Start Date End Date Taking? Authorizing Provider  verapamil (VERELAN) 120 MG 24 hr capsule Take 1 capsule (120 mg total) by mouth at bedtime. 10/24/23  Yes Stephany Ehrich, MD  Ascorbic Acid (VITAMIN C) 1000 MG tablet Take 1,000 mg by mouth daily.    [provider]  aspirin  EC 81  MG tablet Take 1 tablet (81 mg total) by mouth daily. 01/05/19   Jari Merles, PA-C  atorvastatin  (LIPITOR) 10 MG tablet Take 1 tablet (10 mg total) by mouth daily. 01/24/23   Cherre Cornish, NP  BIKTARVY  50-200-25 MG TABS tablet TAKE 1 TABLET BY MOUTH DAILY 07/14/23   Comer, Judithann Novas, MD  Cyanocobalamin (VITAMIN B-12 PO) Take by mouth.    [provider]  estradiol  (ESTRACE ) 2 MG tablet TAKE 2 TABLETS(4 MG) BY MOUTH IN THE MORNING AND AT BEDTIME 10/11/23   Cherre Cornish, NP  finasteride  (PROSCAR ) 5 MG tablet Take 1 tablet (5 mg total) by mouth daily. 10/10/23   Cherre Cornish, NP  fluticasone  (FLONASE ) 50 MCG/ACT nasal spray Place 2 sprays into both nostrils  daily. 07/15/23   Angelia Kelp, PA-C  Minoxidil  5 % FOAM Apply 1 mL topically 2 (two) times daily. 12/25/21   Cherre Cornish, NP  Multiple Vitamin (MULTIVITAMIN) tablet Take 1 tablet by mouth daily.    [provider]  phentermine  15 MG capsule Take 1 capsule (15 mg total) by mouth every morning. 04/11/23   Cherre Cornish, NP  Pyridoxine HCl (VITAMIN B6) 100 MG TABS Take by mouth.    [provider]  spironolactone  (ALDACTONE ) 100 MG tablet Take 1 tablet (100 mg total) by mouth 2 (two) times daily. 07/25/23   Cherre Cornish, NP  topiramate  (TOPAMAX ) 25 MG tablet Take 1 tablet (25 mg total) by mouth 2 (two) times daily. 10/17/23   Cherre Cornish, NP  traMADol  (ULTRAM ) 50 MG tablet Take 1 tablet (50 mg total) by mouth every 6 (six) hours as needed. 04/29/23   Joyce Nixon, MD  tretinoin  (RETIN-A ) 0.1 % cream Apply topically at bedtime. 04/11/23   Cherre Cornish, NP  valACYclovir  (VALTREX ) 500 MG tablet TAKE 1 TABLET(500 MG) BY MOUTH TWICE DAILY 06/14/23   Cherre Cornish, NP  varenicline  (CHANTIX ) 1 MG tablet Take 1 tablet (1 mg total) by mouth 2 (two) times daily. 05/23/23   Cherre Cornish, NP  VITAMIN D PO Take by mouth.    [provider]    Family History Family History  Problem Relation Age of Onset   Breast cancer Paternal Aunt    Colon polyps Maternal Grandmother    Stroke Maternal Grandmother    Skin cancer Maternal Grandmother    Colon polyps Maternal Grandfather    Heart attack Maternal Grandfather    Valvular heart disease Maternal Grandfather    Diabetes Maternal Grandfather    Diabetes Paternal Grandmother    Stroke Paternal Grandfather    Leukemia Other    Colon cancer Neg Hx    Esophageal cancer Neg Hx    Stomach cancer Neg Hx    Rectal cancer Neg Hx     Social History Social History   Tobacco Use   Smoking status: Former    Current packs/day: 0.00    Average packs/day: 1 pack/day for 20.0 years (20.0 ttl pk-yrs)    Types: Cigarettes    Quit date: 07/12/2023     Years since quitting: 0.2   Smokeless tobacco: Never   Tobacco comments:    States she wants to quit  Vaping Use   Vaping status: Former  Substance Use Topics   Alcohol use: Yes    Alcohol/week: 1.0 standard drink of alcohol    Types: 1 Glasses of wine per week    Comment: wine or mixed drink occasionally   Drug use: Yes    Frequency: 7.0 times  per week    Types: Marijuana    Comment: daily     Allergies   Patient has no known allergies.   Review of Systems Review of Systems See HPI  Physical Exam Triage Vital Signs ED Triage Vitals  Encounter Vitals Group     BP 10/24/23 1404 135/86     Girls Systolic BP Percentile --      Girls Diastolic BP Percentile --      Boys Systolic BP Percentile --      Boys Diastolic BP Percentile --      Pulse Rate 10/24/23 1404 86     Resp 10/24/23 1404 16     Temp 10/24/23 1404 98.7 F (37.1 C)     Temp src --      SpO2 10/24/23 1404 96 %     Weight --      Height --      Head Circumference --      Peak Flow --      Pain Score 10/24/23 1407 0     Pain Loc --      Pain Education --      Exclude from Growth Chart --    No data found.  Updated Vital Signs BP 135/86   Pulse 86   Temp 98.7 F (37.1 C)   Resp 16   SpO2 96%      Physical Exam Constitutional:      General: She is not in acute distress.    Appearance: She is well-developed. She is obese. She is not ill-appearing.  HENT:     Head: Normocephalic and atraumatic.   Eyes:     Conjunctiva/sclera: Conjunctivae normal.     Pupils: Pupils are equal, round, and reactive to light.    Cardiovascular:     Rate and Rhythm: Normal rate and regular rhythm.     Heart sounds: Normal heart sounds.  Pulmonary:     Effort: Pulmonary effort is normal. No respiratory distress.     Breath sounds: Normal breath sounds. No rales.  Abdominal:     General: There is no distension.     Palpations: Abdomen is soft.     Tenderness: There is no abdominal tenderness.    Musculoskeletal:        General: Normal range of motion.     Cervical back: Normal range of motion.     Right lower leg: Edema present.     Left lower leg: Edema present.     Comments: 1+ pitting edema bilateral   Skin:    General: Skin is warm and dry.   Neurological:     General: No focal deficit present.     Mental Status: She is alert.     Gait: Gait normal.      UC Treatments / Results  Labs (all labs ordered are listed, but only abnormal results are displayed) Labs Reviewed  POCT URINALYSIS DIP (MANUAL ENTRY) - Abnormal; Notable for the following components:      Result Value   Clarity, UA cloudy (*)    All other components within normal limits  COMPREHENSIVE METABOLIC PANEL WITH GFR  CBC WITH DIFFERENTIAL/PLATELET    EKG   Radiology DG Chest 2 View Result Date: 10/24/2023 CLINICAL DATA:  pedal edema EXAM: CHEST - 2 VIEW COMPARISON:  None available. FINDINGS: No focal airspace consolidation, pleural effusion, or pneumothorax. No cardiomegaly. No acute fracture or destructive lesion. Multilevel thoracic osteophytosis. IMPRESSION: No acute cardiopulmonary abnormality. Electronically Signed  By: Rance Burrows M.D.   On: 10/24/2023 15:03    Procedures ED EKG  Date/Time: 10/24/2023 3:12 PM  Performed by: Stephany Ehrich, MD Authorized by: Stephany Ehrich, MD   ECG interpreted by ED Physician in the absence of a cardiologist: yes   Previous ECG:    Previous ECG:  Compared to current   Similarity:  No change Interpretation:    Interpretation: normal     Details:  79 Rhythm:    Rhythm: sinus rhythm   Ectopy:    Ectopy: none   QRS:    QRS axis:  Normal   QRS intervals:  Normal   QRS conduction: normal   ST segments:    ST segments:  Normal T waves:    T waves: normal   Q waves:    Abnormal Q-waves: not present    (including critical care time)     I explained to the patient I believe her pitting edema is likely medication induced.   Urinalysis shows no protein or sign of nephrotic syndrome.  She has no history of liver problems.  No history of heart disease or heart failure.  For workup we will do an EKG, chest x-ray, and screening lab work.  Follow-up with PCP. We did stop her amlodipine  and put her on verapamil.  Was going to give her an ACE inhibitor, however, on spironolactone  this is not safe. Final Clinical Impressions(s) / UC Diagnoses   Final diagnoses:  Pedal edema  Hypertension goal BP (blood pressure) < 130/80     Discharge Instructions      Stop your amlodipine  Start taking Verelan (verapamil) These are both calcium  channel blocking medicines to reduce blood pressure, verapamil has a low risk of edema, amlodipine  has a high risk of edema Hopefully this will change will take care of your ankle swelling You will be called if there are any significant abnormalities on your blood work Your blood test will be available on MyChart    ED Prescriptions     Medication Sig Dispense Auth. Provider   verapamil (VERELAN) 120 MG 24 hr capsule Take 1 capsule (120 mg total) by mouth at bedtime. 90 capsule Stephany Ehrich, MD      PDMP not reviewed this encounter.   Stephany Ehrich, MD 10/24/23 [phone]

## 2023-10-25 ENCOUNTER — Ambulatory Visit: Payer: Self-pay | Admitting: Family Medicine

## 2023-10-25 LAB — CBC WITH DIFFERENTIAL/PLATELET
Basophils Absolute: 0.1 10*3/uL (ref 0.0–0.2)
Basos: 1 %
EOS (ABSOLUTE): 0.2 10*3/uL (ref 0.0–0.4)
Eos: 3 %
Hematocrit: 45.3 % (ref 34.0–46.6)
Hemoglobin: 15.4 g/dL (ref 11.1–15.9)
Immature Grans (Abs): 0 10*3/uL (ref 0.0–0.1)
Immature Granulocytes: 0 %
Lymphocytes Absolute: 2.4 10*3/uL (ref 0.7–3.1)
Lymphs: 27 %
MCH: 36.6 pg — ABNORMAL HIGH (ref 26.6–33.0)
MCHC: 34 g/dL (ref 31.5–35.7)
MCV: 108 fL — ABNORMAL HIGH (ref 79–97)
Monocytes Absolute: 0.9 10*3/uL (ref 0.1–0.9)
Monocytes: 10 %
Neutrophils Absolute: 5.3 10*3/uL (ref 1.4–7.0)
Neutrophils: 59 %
Platelets: 251 10*3/uL (ref 150–450)
RBC: 4.21 x10E6/uL (ref 3.77–5.28)
RDW: 12.5 % (ref 11.7–15.4)
WBC: 9 10*3/uL (ref 3.4–10.8)

## 2023-10-25 LAB — COMPREHENSIVE METABOLIC PANEL WITH GFR
ALT: 17 IU/L (ref 0–32)
AST: 18 IU/L (ref 0–40)
Albumin: 4.3 g/dL (ref 3.8–4.9)
Alkaline Phosphatase: 74 IU/L (ref 44–121)
BUN/Creatinine Ratio: 13 (ref 9–23)
BUN: 13 mg/dL (ref 6–24)
Bilirubin Total: <0.2 mg/dL (ref 0.0–1.2)
CO2: 19 mmol/L — ABNORMAL LOW (ref 20–29)
Calcium: 9.4 mg/dL (ref 8.7–10.2)
Chloride: 107 mmol/L — ABNORMAL HIGH (ref 96–106)
Creatinine, Ser: 0.98 mg/dL (ref 0.57–1.00)
Globulin, Total: 2.4 g/dL (ref 1.5–4.5)
Glucose: 88 mg/dL (ref 70–99)
Potassium: 4.3 mmol/L (ref 3.5–5.2)
Sodium: 142 mmol/L (ref 134–144)
Total Protein: 6.7 g/dL (ref 6.0–8.5)
eGFR: 70 mL/min/{1.73_m2} (ref >59)

## 2024-01-04 ENCOUNTER — Ambulatory Visit: Payer: Commercial Managed Care - HMO | Admitting: Internal Medicine

## 2024-01-04 ENCOUNTER — Ambulatory Visit: Admitting: Infectious Disease

## 2024-01-04 ENCOUNTER — Other Ambulatory Visit: Payer: Self-pay | Admitting: Medical-Surgical

## 2024-01-04 ENCOUNTER — Other Ambulatory Visit: Payer: Self-pay | Admitting: Internal Medicine

## 2024-01-04 DIAGNOSIS — I10 Essential (primary) hypertension: Secondary | ICD-10-CM

## 2024-01-04 DIAGNOSIS — Z789 Other specified health status: Secondary | ICD-10-CM

## 2024-01-04 DIAGNOSIS — L659 Nonscarring hair loss, unspecified: Secondary | ICD-10-CM

## 2024-01-04 DIAGNOSIS — Z21 Asymptomatic human immunodeficiency virus [HIV] infection status: Secondary | ICD-10-CM

## 2024-01-05 ENCOUNTER — Other Ambulatory Visit: Payer: Self-pay | Admitting: Medical-Surgical

## 2024-01-10 ENCOUNTER — Encounter: Payer: Self-pay | Admitting: Sports Medicine

## 2024-01-13 ENCOUNTER — Encounter: Payer: Self-pay | Admitting: Medical-Surgical

## 2024-01-13 ENCOUNTER — Ambulatory Visit (INDEPENDENT_AMBULATORY_CARE_PROVIDER_SITE_OTHER): Admitting: Medical-Surgical

## 2024-01-13 VITALS — BP 119/75 | HR 71 | Resp 20 | Ht 68.0 in | Wt 253.0 lb

## 2024-01-13 DIAGNOSIS — I700 Atherosclerosis of aorta: Secondary | ICD-10-CM

## 2024-01-13 DIAGNOSIS — J439 Emphysema, unspecified: Secondary | ICD-10-CM | POA: Diagnosis not present

## 2024-01-13 DIAGNOSIS — E66812 Obesity, class 2: Secondary | ICD-10-CM

## 2024-01-13 DIAGNOSIS — Z789 Other specified health status: Secondary | ICD-10-CM | POA: Diagnosis not present

## 2024-01-13 DIAGNOSIS — I10 Essential (primary) hypertension: Secondary | ICD-10-CM | POA: Diagnosis not present

## 2024-01-13 DIAGNOSIS — Z122 Encounter for screening for malignant neoplasm of respiratory organs: Secondary | ICD-10-CM

## 2024-01-13 DIAGNOSIS — Z6838 Body mass index (BMI) 38.0-38.9, adult: Secondary | ICD-10-CM

## 2024-01-13 MED ORDER — ATORVASTATIN CALCIUM 10 MG PO TABS
10.0000 mg | ORAL_TABLET | Freq: Every day | ORAL | 3 refills | Status: AC
Start: 2024-01-13 — End: ?

## 2024-01-13 MED ORDER — PHENTERMINE HCL 37.5 MG PO TABS: ORAL_TABLET | ORAL | 2 refills | Status: AC

## 2024-01-13 NOTE — Progress Notes (Signed)
        Established patient visit   History of Present Illness   Discussed the use of AI scribe software for clinical note transcription with the patient, who gave verbal consent to proceed.  History of Present Illness   Holly Walker is a 53 year old female with hypertension who presents with concerns about ankle swelling.  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  Constitutional:      General: She is not in acute distress.    Appearance: Normal appearance. She is obese. She is not ill-appearing.  HENT:     Head: Normocephalic and atraumatic.  Cardiovascular:     Rate and Rhythm: Normal rate and regular rhythm.     Pulses: Normal pulses.     Heart sounds: Normal heart sounds. No murmur heard.    No friction rub. No gallop.  Pulmonary:     Effort: Pulmonary effort is normal. No respiratory distress.     Breath sounds: Normal breath sounds. No wheezing.  Skin:    General: Skin is warm and dry.  Neurological:     Mental Status: She is alert and oriented to person, place, and time.  Psychiatric:        Mood and Affect: Mood normal.        Behavior: Behavior normal.        Thought Content: Thought content normal.        Judgment: Judgment normal.     Assessment & Plan   Assessment and Plan    Essential hypertension with medication-induced peripheral edema Peripheral edema resolved after medication change. Blood pressure well-controlled. - Continue verapamil  120mg  daily. - Recommend home monitoring, low sodium diet, weight loss, and regular intentional exercise.  Hyperlipidemia/aortic atherosclerosis Managed with Lipitor. - Continue atorvastatin  10mg  daily.  Class 2 obesity due to excess calories with serious comorbidity Interested in non-surgical weight management options, including phentermine . - Prescribe phentermine  37.5 mg for up to 3 months. - Encourage regular exercise and healthy eating habits.  Transgender woman Long-term use of exogenous hormones for GAHT. Currently happy with results.  - Checking testosterone  and estradiol  today.  - Continue spironolactone  and estradiol  BID.   Lung cancer screening - Ordered low dose chest CT for annual evaluation.   Pulmonary emphysema Found on imaging for annual lung cancer screening. No current concerns or symptoms. No current treatment.  - Continue to avoid smoking/vaping.  - Monitor for respiratory symptoms.     Follow up   Return in about 3 months (around 04/13/2024) for weight check. __________________________________ Zada FREDRIK Palin, DNP, APRN, FNP-BC Primary Care and Sports Medicine [facility] [place]

## 2024-01-14 LAB — LIPID PANEL
Chol/HDL Ratio: 3.7 ratio (ref 0.0–4.4)
Cholesterol, Total: 136 mg/dL (ref 100–199)
HDL: 37 mg/dL — ABNORMAL LOW (ref >39)
LDL Chol Calc (NIH): 77 mg/dL (ref 0–99)
Triglycerides: 124 mg/dL (ref 0–149)
VLDL Cholesterol Cal: 22 mg/dL (ref 5–40)

## 2024-01-14 LAB — SPECIMEN STATUS REPORT

## 2024-01-14 LAB — TESTOSTERONE: Testosterone: 111 ng/dL — ABNORMAL HIGH (ref 4–50)

## 2024-01-14 LAB — ESTRADIOL: Estradiol: 46.9 pg/mL

## 2024-01-15 ENCOUNTER — Ambulatory Visit: Payer: Self-pay | Admitting: Medical-Surgical

## 2024-01-19 ENCOUNTER — Ambulatory Visit

## 2024-01-19 DIAGNOSIS — Z87891 Personal history of nicotine dependence: Secondary | ICD-10-CM | POA: Diagnosis not present

## 2024-01-19 DIAGNOSIS — Z122 Encounter for screening for malignant neoplasm of respiratory organs: Secondary | ICD-10-CM | POA: Diagnosis not present

## 2024-01-27 ENCOUNTER — Telehealth: Payer: Self-pay

## 2024-01-27 MED ORDER — VERAPAMIL HCL ER 120 MG PO CP24: 120.0000 mg | ORAL_CAPSULE | Freq: Every day | ORAL | 0 refills | Status: DC

## 2024-01-27 NOTE — Telephone Encounter (Signed)
 Spoke with patient- verified pharmacy for refill. Sent prescription refill per protocol

## 2024-02-05 ENCOUNTER — Encounter: Payer: Self-pay | Admitting: Infectious Disease

## 2024-02-05 DIAGNOSIS — B20 Human immunodeficiency virus [HIV] disease: Secondary | ICD-10-CM | POA: Insufficient documentation

## 2024-02-05 NOTE — Progress Notes (Unsigned)
 Subjective:  Chief complaint: follow-up for HIV disease on medications   Patient ID: Holly Walker, adult    DOB: [birth year], 53 y.o.   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   History of shingles 03/2017   right flank   HIV disease (HCC) 02/05/2024   HIV infection (HCC)    Hypertension    IBS (irritable bowel syndrome)    Low serum thyroid stimulating hormone (TSH) 09/21/2017   Tobacco use     Past Surgical History:  Procedure Laterality Date   CHOLECYSTECTOMY     COLONOSCOPY  1993   CYST EXCISION     back   CYST REMOVAL LEG     HIGH RESOLUTION ANOSCOPY N/A 04/29/2023   Procedure: HIGH RESOLUTION ANOSCOPY WITH BIOPSY;  Surgeon: Debby Hila, MD;  Location: [facility];  Service: General;  Laterality: N/A;   INJECTION OF SILICONE OIL      Family History  Problem Relation Age of Onset   Breast cancer Paternal Aunt    Colon polyps Maternal Grandmother    Stroke Maternal Grandmother    Skin cancer Maternal Grandmother    Colon polyps Maternal Grandfather    Heart attack Maternal Grandfather    Valvular heart disease Maternal Grandfather    Diabetes Maternal Grandfather    Diabetes Paternal Grandmother    Stroke Paternal Grandfather    Leukemia Other    Colon cancer Neg Hx    Esophageal cancer Neg Hx    Stomach cancer Neg Hx    Rectal cancer Neg Hx       Social History   Socioeconomic History   Marital status: Media planner    Spouse name: Dominik   Number of children: Not on file   Years of education: Not on file   Highest education level: Not on file  Occupational History   Not on file  Tobacco Use   Smoking status: Former    Current packs/day: 0.00    Average packs/day: 1 pack/day for 20.0 years (20.0 ttl pk-yrs)    Types: Cigarettes    Quit date: 07/12/2023    Years since quitting: 0.5   Smokeless tobacco: Never   Tobacco  comments:    States she wants to quit  Vaping Use   Vaping status: Former  Substance and Sexual Activity   Alcohol use: Yes    Alcohol/week: 1.0 standard drink of alcohol    Types: 1 Glasses of wine per week    Comment: wine or mixed drink occasionally   Drug use: Yes    Frequency: 7.0 times per week    Types: Marijuana    Comment: daily   Sexual activity: Yes    Birth control/protection: None    Comment: accepted condoms  Other Topics Concern   Not on file  Social History Narrative   Not on file   Social Drivers of Health   Financial Resource Strain: Medium Risk (01/04/2023)   Overall Financial Resource Strain (CARDIA)    Difficulty of Paying Living Expenses: Somewhat hard  Food Insecurity: Patient Declined (01/04/2023)   Hunger Vital Sign    Worried About Running Out of Food in the Last Year: Patient declined    Ran Out of Food in the Last Year: Patient declined  Transportation Needs: Patient Declined (01/04/2023)   PRAPARE - Administrator, Civil Service (Medical):  Patient declined    Lack of Transportation (Non-Medical): Patient declined  Physical Activity: Sufficiently Active (01/04/2023)   Exercise Vital Sign    Days of Exercise per Week: 2 days    Minutes of Exercise per Session: 150+ min  Stress: Stress Concern Present (01/04/2023)   [employer] of Occupational Health - Occupational Stress Questionnaire    Feeling of Stress : Rather much  Social Connections: Unknown (01/04/2023)   Social Connection and Isolation Panel    Frequency of Communication with Friends and Family: More than three times a week    Frequency of Social Gatherings with Friends and Family: Once a week    Attends Religious Services: Patient declined    Database administrator or Organizations: Not on file    Attends Banker Meetings: Not on file    Marital Status: Separated    No Known Allergies   Current Outpatient Medications:    Ascorbic Acid (VITAMIN C) 1000  MG tablet, Take 1,000 mg by mouth daily., Disp: , Rfl:    aspirin  EC 81 MG tablet, Take 1 tablet (81 mg total) by mouth daily., Disp: 90 tablet, Rfl: 3   atorvastatin  (LIPITOR) 10 MG tablet, Take 1 tablet (10 mg total) by mouth daily., Disp: 90 tablet, Rfl: 3   BIKTARVY  50-200-25 MG TABS tablet, TAKE 1 TABLET BY MOUTH DAILY, Disp: 30 tablet, Rfl: 1   Cyanocobalamin (VITAMIN B-12 PO), Take by mouth., Disp: , Rfl:    estradiol  (ESTRACE ) 2 MG tablet, TAKE 2 TABLETS(4 MG) BY MOUTH IN THE MORNING AND AT BEDTIME, Disp: 360 tablet, Rfl: 0   finasteride  (PROSCAR ) 5 MG tablet, TAKE 1 TABLET(5 MG) BY MOUTH DAILY, Disp: 90 tablet, Rfl: 0   Minoxidil  5 % FOAM, Apply 1 mL topically 2 (two) times daily., Disp: 180 g, Rfl: 3   Multiple Vitamin (MULTIVITAMIN) tablet, Take 1 tablet by mouth daily., Disp: , Rfl:    phentermine  (ADIPEX-P ) 37.5 MG tablet, One tab by mouth qAM, Disp: 30 tablet, Rfl: 2   Pyridoxine HCl (VITAMIN B6) 100 MG TABS, Take by mouth., Disp: , Rfl:    spironolactone  (ALDACTONE ) 100 MG tablet, Take 1 tablet (100 mg total) by mouth 2 (two) times daily., Disp: 180 tablet, Rfl: 3   topiramate  (TOPAMAX ) 25 MG tablet, Take 1 tablet (25 mg total) by mouth 2 (two) times daily., Disp: 180 tablet, Rfl: 1   tretinoin  (RETIN-A ) 0.1 % cream, Apply topically at bedtime., Disp: 15 g, Rfl: 3   valACYclovir  (VALTREX ) 500 MG tablet, TAKE 1 TABLET(500 MG) BY MOUTH TWICE DAILY, Disp: 60 tablet, Rfl: 0   verapamil  (VERELAN ) 120 MG 24 hr capsule, Take 1 capsule (120 mg total) by mouth at bedtime., Disp: 90 capsule, Rfl: 0   VITAMIN D PO, Take by mouth., Disp: , Rfl:    Review of Systems     Objective:   Physical Exam        Assessment & Plan:

## 2024-02-06 ENCOUNTER — Ambulatory Visit: Admitting: Infectious Disease

## 2024-02-06 ENCOUNTER — Other Ambulatory Visit: Payer: Self-pay

## 2024-02-06 ENCOUNTER — Encounter: Payer: Self-pay | Admitting: Infectious Disease

## 2024-02-06 ENCOUNTER — Other Ambulatory Visit (HOSPITAL_COMMUNITY)
Admission: RE | Admit: 2024-02-06 | Discharge: 2024-02-06 | Disposition: A | Discharge status: Home / Self Care | Source: Ambulatory Visit | Attending: Infectious Disease | Admitting: Infectious Disease

## 2024-02-06 VITALS — BP 116/73 | HR 90 | Temp 97.5°F | Ht 68.0 in | Wt 245.0 lb

## 2024-02-06 DIAGNOSIS — Z6838 Body mass index (BMI) 38.0-38.9, adult: Secondary | ICD-10-CM | POA: Diagnosis present

## 2024-02-06 DIAGNOSIS — B20 Human immunodeficiency virus [HIV] disease: Secondary | ICD-10-CM

## 2024-02-06 DIAGNOSIS — E785 Hyperlipidemia, unspecified: Secondary | ICD-10-CM | POA: Diagnosis not present

## 2024-02-06 DIAGNOSIS — Z23 Encounter for immunization: Secondary | ICD-10-CM

## 2024-02-06 DIAGNOSIS — F17210 Nicotine dependence, cigarettes, uncomplicated: Secondary | ICD-10-CM

## 2024-02-06 DIAGNOSIS — E66812 Obesity, class 2: Secondary | ICD-10-CM

## 2024-02-06 DIAGNOSIS — I10 Essential (primary) hypertension: Secondary | ICD-10-CM

## 2024-02-06 DIAGNOSIS — R85610 Atypical squamous cells of undetermined significance on cytologic smear of anus (ASC-US): Secondary | ICD-10-CM | POA: Diagnosis not present

## 2024-02-06 DIAGNOSIS — Z21 Asymptomatic human immunodeficiency virus [HIV] infection status: Secondary | ICD-10-CM

## 2024-02-06 DIAGNOSIS — Z789 Other specified health status: Secondary | ICD-10-CM

## 2024-02-06 DIAGNOSIS — Z113 Encounter for screening for infections with a predominantly sexual mode of transmission: Secondary | ICD-10-CM

## 2024-02-06 DIAGNOSIS — Z720 Tobacco use: Secondary | ICD-10-CM

## 2024-02-06 DIAGNOSIS — Z79899 Other long term (current) drug therapy: Secondary | ICD-10-CM | POA: Diagnosis not present

## 2024-02-06 DIAGNOSIS — I700 Atherosclerosis of aorta: Secondary | ICD-10-CM

## 2024-02-06 MED ORDER — BIKTARVY 50-200-25 MG PO TABS: 1.0000 | ORAL_TABLET | Freq: Every day | ORAL | 1 refills | Status: DC

## 2024-02-06 NOTE — Patient Instructions (Signed)
 Smoking Cessation: QuitlineNC 1-800-QUIT-NOW 670-772-4699); Espaol: 1-855-Djelo-Ya (1-737-271-8849) [email or web address].[email or web address]

## 2024-02-07 LAB — T-HELPER CELLS (CD4) COUNT (NOT AT ARMC)
CD4 % Helper T Cell: 23 % — ABNORMAL LOW (ref 33–65)
CD4 T Cell Abs: 548 /uL (ref 400–1790)

## 2024-02-08 LAB — URINE CYTOLOGY ANCILLARY ONLY
Chlamydia: NEGATIVE
Comment: NEGATIVE
Comment: NORMAL
Neisseria Gonorrhea: NEGATIVE

## 2024-02-08 LAB — CYTOLOGY, (ORAL, ANAL, URETHRAL) ANCILLARY ONLY
Chlamydia: NEGATIVE
Chlamydia: NEGATIVE
Comment: NEGATIVE
Comment: NEGATIVE
Comment: NORMAL
Comment: NORMAL
Neisseria Gonorrhea: NEGATIVE
Neisseria Gonorrhea: NEGATIVE

## 2024-02-10 LAB — CBC WITH DIFFERENTIAL/PLATELET
Absolute Lymphocytes: 2601 {cells}/uL (ref 850–3900)
Absolute Monocytes: 680 {cells}/uL (ref 200–950)
Basophils Absolute: 51 {cells}/uL (ref 0–200)
Basophils Relative: 0.6 %
Eosinophils Absolute: 187 {cells}/uL (ref 15–500)
Eosinophils Relative: 2.2 %
HCT: 46.7 % — ABNORMAL HIGH (ref 35.0–45.0)
Hemoglobin: 15.9 g/dL — ABNORMAL HIGH (ref 11.7–15.5)
MCH: 36.7 pg — ABNORMAL HIGH (ref 27.0–33.0)
MCHC: 34 g/dL (ref 32.0–36.0)
MCV: 107.9 fL — ABNORMAL HIGH (ref 80.0–100.0)
MPV: 10.1 fL (ref 7.5–12.5)
Monocytes Relative: 8 %
Neutro Abs: 4981 {cells}/uL (ref 1500–7800)
Neutrophils Relative %: 58.6 %
Platelets: 237 Thousand/uL (ref 140–400)
RBC: 4.33 Million/uL (ref 3.80–5.10)
RDW: 12.6 % (ref 11.0–15.0)
Total Lymphocyte: 30.6 %
WBC: 8.5 Thousand/uL (ref 3.8–10.8)

## 2024-02-10 LAB — LIPID PANEL
Cholesterol: 135 mg/dL (ref <200)
HDL: 39 mg/dL — ABNORMAL LOW (ref >=50)
LDL Cholesterol (Calc): 75 mg/dL
Non-HDL Cholesterol (Calc): 96 mg/dL (ref <130)
Total CHOL/HDL Ratio: 3.5 (calc) (ref <5.0)
Triglycerides: 133 mg/dL (ref <150)

## 2024-02-10 LAB — COMPLETE METABOLIC PANEL WITHOUT GFR
AG Ratio: 1.9 (calc) (ref 1.0–2.5)
ALT: 16 U/L (ref 6–29)
AST: 14 U/L (ref 10–35)
Albumin: 4.4 g/dL (ref 3.6–5.1)
Alkaline phosphatase (APISO): 56 U/L (ref 37–153)
BUN: 12 mg/dL (ref 7–25)
CO2: 25 mmol/L (ref 20–32)
Calcium: 9.1 mg/dL (ref 8.6–10.4)
Chloride: 107 mmol/L (ref 98–110)
Creat: 0.95 mg/dL (ref 0.50–1.03)
Globulin: 2.3 g/dL (ref 1.9–3.7)
Glucose, Bld: 102 mg/dL — ABNORMAL HIGH (ref 65–99)
Potassium: 4.1 mmol/L (ref 3.5–5.3)
Sodium: 138 mmol/L (ref 135–146)
Total Bilirubin: 0.3 mg/dL (ref 0.2–1.2)
Total Protein: 6.7 g/dL (ref 6.1–8.1)

## 2024-02-10 LAB — HIV-1 RNA QUANT-NO REFLEX-BLD
HIV 1 RNA Quant: <20 {copies}/mL — AB
HIV-1 RNA Quant, Log: <1.3 {Log_copies}/mL — AB

## 2024-02-10 LAB — ESTRADIOL: Estradiol: 74 pg/mL

## 2024-02-10 LAB — RPR: RPR Ser Ql: NONREACTIVE

## 2024-02-22 NOTE — Telephone Encounter (Signed)
 Addressed different encounter

## 2024-04-23 ENCOUNTER — Other Ambulatory Visit: Payer: Self-pay | Admitting: Medical-Surgical

## 2024-04-30 ENCOUNTER — Other Ambulatory Visit: Payer: Self-pay

## 2024-04-30 DIAGNOSIS — Z21 Asymptomatic human immunodeficiency virus [HIV] infection status: Secondary | ICD-10-CM

## 2024-04-30 MED ORDER — BIKTARVY 50-200-25 MG PO TABS: 1.0000 | ORAL_TABLET | Freq: Every day | ORAL | 5 refills | Status: AC

## 2024-04-30 NOTE — Progress Notes (Signed)
 The 10-year ASCVD risk score (Arnett DK, et al., 2019) is: 6.1%   Values used to calculate the score:     Age: 53 years     Clinically relevant sex: Could not be determined (highest resulting score shown)     Is Non-Hispanic African American: No     Diabetic: No     Tobacco smoker: Yes     Systolic Blood Pressure: 116 mmHg     Is BP treated: Yes     HDL Cholesterol: 39 mg/dL     Total Cholesterol: 135 mg/dL  Currently prescribed atorvastatin  10 mg.  Ann-Marie Kluge, BSN, RN

## 2024-05-01 ENCOUNTER — Other Ambulatory Visit: Payer: Self-pay | Admitting: Medical-Surgical

## 2024-05-01 DIAGNOSIS — L659 Nonscarring hair loss, unspecified: Secondary | ICD-10-CM

## 2024-05-28 ENCOUNTER — Ambulatory Visit: Payer: Self-pay | Admitting: Infectious Disease

## 2024-07-11 ENCOUNTER — Ambulatory Visit: Admitting: Infectious Disease

## 2024-07-12 ENCOUNTER — Ambulatory Visit: Admitting: Medical-Surgical
